# Patient Record
Sex: Female | Born: 1975
Health system: Southern US, Community
[De-identification: ages and names within clinical notes are randomized; demographics above are authoritative.]

## PROBLEM LIST (undated history)

## (undated) DIAGNOSIS — K7689 Other specified diseases of liver: Secondary | ICD-10-CM

## (undated) HISTORY — DX: Other specified diseases of liver: K76.89

---

## 2016-07-09 NOTE — L&D Delivery Note (Signed)
Delivery Note At 9:30 AM a viable female was delivered via Vaginal, Spontaneous Delivery (Presentation: cephalic; ROA).  APGAR: 9, 9; weight 2985 g.   Placenta status: intact, grossly normal.  Cord 3 vessel.  Anesthesia: epidural Episiotomy: None Lacerations: 2nd degree Suture Repair: monocryl Est. Blood Loss (mL):    Mom to postpartum.  Baby to Couplet care / Skin to Skin.  [name] 02/27/2017, 10:01 AM

## 2016-08-13 ENCOUNTER — Encounter (INDEPENDENT_AMBULATORY_CARE_PROVIDER_SITE_OTHER): Payer: Self-pay

## 2016-08-13 ENCOUNTER — Ambulatory Visit (INDEPENDENT_AMBULATORY_CARE_PROVIDER_SITE_OTHER): Payer: BLUE CROSS/BLUE SHIELD | Admitting: Advanced Practice Midwife

## 2016-08-13 ENCOUNTER — Encounter: Payer: Self-pay | Admitting: Advanced Practice Midwife

## 2016-08-13 VITALS — BP 102/62 | HR 84 | Ht 67.0 in | Wt 132.0 lb

## 2016-08-13 DIAGNOSIS — Z113 Encounter for screening for infections with a predominantly sexual mode of transmission: Secondary | ICD-10-CM

## 2016-08-13 DIAGNOSIS — Z3401 Encounter for supervision of normal first pregnancy, first trimester: Secondary | ICD-10-CM

## 2016-08-13 DIAGNOSIS — O09511 Supervision of elderly primigravida, first trimester: Secondary | ICD-10-CM | POA: Diagnosis not present

## 2016-08-13 DIAGNOSIS — Z3689 Encounter for other specified antenatal screening: Secondary | ICD-10-CM | POA: Diagnosis not present

## 2016-08-13 DIAGNOSIS — O0990 Supervision of high risk pregnancy, unspecified, unspecified trimester: Secondary | ICD-10-CM | POA: Insufficient documentation

## 2016-08-13 LAB — OB RESULTS CONSOLE GC/CHLAMYDIA: GC PROBE AMP, GENITAL: NEGATIVE

## 2016-08-13 NOTE — Progress Notes (Signed)
Bedside U/S shows IUP with FHT of 173 BPM and CRL is 24.70mm  GA is [redacted]w[redacted]d. Last pap in [place] 2 years ago.  Never had a positive pap.

## 2016-08-14 LAB — PRENATAL PROFILE (SOLSTAS)
ANTIBODY SCREEN: NEGATIVE
Basophils Absolute: 0 cells/uL (ref 0–200)
Basophils Relative: 0 %
Eosinophils Absolute: 109 cells/uL (ref 15–500)
Eosinophils Relative: 1 %
HEMATOCRIT: 43.5 % (ref 35.0–45.0)
HIV 1&2 Ab, 4th Generation: NONREACTIVE
Hemoglobin: 14.4 g/dL (ref 11.7–15.5)
Hepatitis B Surface Ag: NEGATIVE
LYMPHS PCT: 14 %
Lymphs Abs: 1526 cells/uL (ref 850–3900)
MCH: 28.9 pg (ref 27.0–33.0)
MCHC: 33.1 g/dL (ref 32.0–36.0)
MCV: 87.3 fL (ref 80.0–100.0)
MONO ABS: 872 {cells}/uL (ref 200–950)
MONOS PCT: 8 %
MPV: 9.3 fL (ref 7.5–12.5)
Neutro Abs: 8393 cells/uL — ABNORMAL HIGH (ref 1500–7800)
Neutrophils Relative %: 77 %
Platelets: 358 10*3/uL (ref 140–400)
RBC: 4.98 MIL/uL (ref 3.80–5.10)
RDW: 13.2 % (ref 11.0–15.0)
RH TYPE: POSITIVE
Rubella: 22 Index — ABNORMAL HIGH (ref <0.90)
WBC: 10.9 10*3/uL — AB (ref 3.8–10.8)

## 2016-08-14 LAB — GLUCOSE, RANDOM: Glucose, Bld: 80 mg/dL (ref 65–99)

## 2016-08-14 LAB — PAIN MGMT, PROFILE 6 CONF W/O MM, U
6 Acetylmorphine: NEGATIVE ng/mL (ref <10)
AMPHETAMINES: NEGATIVE ng/mL (ref <500)
Alcohol Metabolites: NEGATIVE ng/mL (ref <500)
BARBITURATES: NEGATIVE ng/mL (ref <300)
Benzodiazepines: NEGATIVE ng/mL (ref <100)
COCAINE METABOLITE: NEGATIVE ng/mL (ref <150)
CREATININE: 78 mg/dL (ref >=20.0)
Marijuana Metabolite: NEGATIVE ng/mL (ref <20)
Methadone Metabolite: NEGATIVE ng/mL (ref <100)
OXIDANT: NEGATIVE ug/mL (ref <200)
OXYCODONE: NEGATIVE ng/mL (ref <100)
Opiates: NEGATIVE ng/mL (ref <100)
PH: 6.98 (ref 4.5–9.0)
Phencyclidine: NEGATIVE ng/mL (ref <25)
Please note:: 0

## 2016-08-14 LAB — GC/CHLAMYDIA PROBE AMP (~~LOC~~) NOT AT ARMC
Chlamydia: NEGATIVE
Neisseria Gonorrhea: NEGATIVE

## 2016-08-14 LAB — SICKLE CELL SCREEN: SICKLE CELL SCREEN: NEGATIVE

## 2016-08-14 LAB — HEMOGLOBIN A1C
Hgb A1c MFr Bld: 4.7 % (ref <5.7)
Mean Plasma Glucose: 88 mg/dL

## 2016-08-15 ENCOUNTER — Encounter: Payer: Self-pay | Admitting: Advanced Practice Midwife

## 2016-08-15 LAB — CULTURE, OB URINE
Colony Count: NO GROWTH
ORGANISM ID, BACTERIA: NO GROWTH

## 2016-08-15 NOTE — Progress Notes (Signed)
  Subjective:    Olivia Salinas is a G1P0 [redacted]w[redacted]d being seen today for her first obstetrical visit.  Her obstetrical history is significant for advanced maternal age. Patient does intend to breast feed. Pregnancy history fully reviewed.  Patient reports no complaints.  Vitals:   08/13/16 1104 08/13/16 1108  BP: 102/62   Pulse: 84   Weight: 132 lb (59.9 kg)   Height:  5\' 7"  (1.702 m)    HISTORY: OB History  Gravida Para Term Preterm AB Living  1            SAB TAB Ectopic Multiple Live Births               # Outcome Date GA Lbr Len/2nd Weight Sex Delivery Anes PTL Lv  1 Current              History reviewed. No pertinent past medical history. History reviewed. No pertinent surgical history. Family History  Problem Relation Age of Onset  . Hypertension Mother   . Arthritis Mother   . Arthritis Father      Exam    Uterus:     Pelvic Exam:    Perineum: No Hemorrhoids   Vulva: Bartholin's, Urethra, Skene's normal   Vagina:  normal discharge   pH:    Cervix: no cervical motion tenderness   Adnexa: no mass, fullness, tenderness   Bony Pelvis: gynecoid  System: Breast:  normal appearance, no masses or tenderness   Skin: normal coloration and turgor, no rashes    Neurologic: oriented, grossly non-focal   Extremities: normal strength, tone, and muscle mass   HEENT neck supple with midline trachea   Mouth/Teeth mucous membranes moist, pharynx normal without lesions   Neck supple   Cardiovascular: regular rate and rhythm   Respiratory:  appears well, vitals normal, no respiratory distress, acyanotic, normal RR, ear and throat exam is normal, neck free of mass or lymphadenopathy, chest clear, no wheezing, crepitations, rhonchi, normal symmetric air entry   Abdomen: soft, non-tender; bowel sounds normal; no masses,  no organomegaly   Urinary: urethral meatus normal      Assessment:    Pregnancy: G1P0 Patient Active Problem List   Diagnosis Date Noted  . Supervision  of normal pregnancy 08/13/2016  . High-risk pregnancy, primigravida of advanced maternal age in first trimester 08/13/2016        Plan:     Initial labs drawn. Prenatal vitamins. Problem list reviewed and updated. Genetic Screening discussed Integrated Screen: Will order panorama when appropriate due to AMA.  Ultrasound discussed; fetal survey: requested.  Follow up in 4 weeks. 50% of 30 min visit spent on counseling and coordination of care.   Welcomed to practice Routines reviewed   Hansel Feinstein 08/15/2016

## 2016-08-21 LAB — CFVANTAGE CYSTIC FIB EXP SCREEN

## 2016-09-10 ENCOUNTER — Ambulatory Visit (INDEPENDENT_AMBULATORY_CARE_PROVIDER_SITE_OTHER): Payer: BLUE CROSS/BLUE SHIELD | Admitting: Certified Nurse Midwife

## 2016-09-10 VITALS — BP 104/70 | HR 91 | Wt 130.0 lb

## 2016-09-10 DIAGNOSIS — O99612 Diseases of the digestive system complicating pregnancy, second trimester: Secondary | ICD-10-CM | POA: Diagnosis not present

## 2016-09-10 DIAGNOSIS — K5900 Constipation, unspecified: Secondary | ICD-10-CM | POA: Diagnosis not present

## 2016-09-10 DIAGNOSIS — Z3402 Encounter for supervision of normal first pregnancy, second trimester: Secondary | ICD-10-CM

## 2016-09-10 NOTE — Progress Notes (Signed)
Subjective:  Olivia Salinas is a 41 y.o. G1P0 at [redacted]w[redacted]d being seen today for ongoing prenatal care.  She is currently monitored for the following issues for this low-risk pregnancy and has Supervision of normal pregnancy and High-risk pregnancy, primigravida of advanced maternal age in first trimester on her problem list.  Patient reports constipation and hard stools.  Contractions: Not present. Vag. Bleeding: None.   . Denies leaking of fluid.   The following portions of the patient's history were reviewed and updated as appropriate: allergies, current medications, past family history, past medical history, past social history, past surgical history and problem list. Problem list updated.  Objective:   Vitals:   09/10/16 0834  BP: 104/70  Pulse: 91  Weight: 130 lb (59 kg)    Fetal Status: Fetal Heart Rate (bpm): 166         General:  Alert, oriented and cooperative. Patient is in no acute distress.  Skin: Skin is warm and dry. No rash noted.   Cardiovascular: Normal heart rate noted  Respiratory: Normal respiratory effort, no problems with respiration noted  Abdomen: Soft, gravid, appropriate for gestational age. Pain/Pressure: Absent     Pelvic: Vag. Bleeding: None Vag D/C Character: Thin   Cervical exam deferred        Extremities: Normal range of motion.  Edema: None  Mental Status: Normal mood and affect. Normal behavior. Normal judgment and thought content.   Urinalysis: Urine Protein: Negative Urine Glucose: Negative  Assessment and Plan:  Pregnancy: G1P0 at [redacted]w[redacted]d  1. Encounter for supervision of normal first pregnancy in second trimester - Second trimester anticipatory guidance - schedule anatomy US - Panorama today   2. Constipation during pregnancy in second trimester - increase water intake - more dietary fiber - stool softener  Preterm labor symptoms and general obstetric precautions including but not limited to vaginal bleeding, contractions, leaking of fluid  and fetal movement were reviewed in detail with the patient. Please refer to After Visit Summary for other counseling recommendations.  Return in about 4 weeks (around 10/08/2016).   Julianne Handler, CNM

## 2016-09-10 NOTE — Addendum Note (Signed)
Addended by: Sherryle Lis L on: 09/10/2016 09:00 AM   Modules accepted: Orders

## 2016-09-20 ENCOUNTER — Encounter: Payer: Self-pay | Admitting: *Deleted

## 2016-09-20 DIAGNOSIS — Z3402 Encounter for supervision of normal first pregnancy, second trimester: Secondary | ICD-10-CM

## 2016-10-08 ENCOUNTER — Ambulatory Visit (INDEPENDENT_AMBULATORY_CARE_PROVIDER_SITE_OTHER): Payer: BLUE CROSS/BLUE SHIELD | Admitting: Advanced Practice Midwife

## 2016-10-08 ENCOUNTER — Encounter: Payer: Self-pay | Admitting: Advanced Practice Midwife

## 2016-10-08 VITALS — BP 87/60 | HR 78 | Wt 131.0 lb

## 2016-10-08 DIAGNOSIS — O99612 Diseases of the digestive system complicating pregnancy, second trimester: Secondary | ICD-10-CM

## 2016-10-08 DIAGNOSIS — K5900 Constipation, unspecified: Secondary | ICD-10-CM

## 2016-10-08 NOTE — Progress Notes (Signed)
   PRENATAL VISIT NOTE  Subjective:  Olivia Salinas is a 41 y.o. G1P0 at [redacted]w[redacted]d being seen today for ongoing prenatal care.  She is currently monitored for the following issues for this low-risk pregnancy and has Supervision of normal pregnancy and High-risk pregnancy, primigravida of advanced maternal age in first trimester on her problem list.  Patient reports no complaints.  Contractions: Not present. Vag. Bleeding: None.  Movement: Absent. Denies leaking of fluid.   The following portions of the patient's history were reviewed and updated as appropriate: allergies, current medications, past family history, past medical history, past social history, past surgical history and problem list. Problem list updated.  Objective:   Vitals:   10/08/16 0816  BP: (!) 87/60  Pulse: 78  Weight: 131 lb (59.4 kg)    Fetal Status: Fetal Heart Rate (bpm): 156   Movement: Absent     General:  Alert, oriented and cooperative. Patient is in no acute distress.  Skin: Skin is warm and dry. No rash noted.   Cardiovascular: Normal heart rate noted  Respiratory: Normal respiratory effort, no problems with respiration noted  Abdomen: Soft, gravid, appropriate for gestational age. Pain/Pressure: Present     Pelvic:  Cervical exam deferred        Extremities: Normal range of motion.  Edema: None  Mental Status: Normal mood and affect. Normal behavior. Normal judgment and thought content.   Assessment and Plan:  Pregnancy: G1P0 at [redacted]w[redacted]d  1. Constipation during pregnancy in second trimester      Discussed fiber laxatives  Preterm labor symptoms and general obstetric precautions including but not limited to vaginal bleeding, contractions, leaking of fluid and fetal movement were reviewed in detail with the patient. Please refer to After Visit Summary for other counseling recommendations.  Return in about 4 weeks (around 11/05/2016) for [employer].   Seabron Spates, CNM

## 2016-10-08 NOTE — Patient Instructions (Signed)
Methylcellulose capsules or tablets What is this medicine? METHYLCELLULOSE (meth ill SELL yoo lose) is a bulk-forming laxative. This medicine is used to treat constipation. This medicine may be used for other purposes; ask your health care provider or pharmacist if you have questions. COMMON BRAND NAME(S): Citrucel, Fiber Therapy What should I tell my health care provider before I take this medicine? They need to know if you have any of these conditions: -blockage of the intestines or bowel -change in bowel habits for more than 14 days -nausea or vomiting -phenylketonuria -stomach pain -trouble swallowing -an unusual or allergic reaction to methylcellulose, other medicines, foods, dyes, or preservatives -pregnant or trying or get pregnant -breast-feeding How should I use this medicine? Take this medicine by mouth with a full glass of water. Follow the directions on the package labeling, or take as directed by your health care professional. Take your medicine at regular intervals. Do not take your medicine more often than directed. Talk to your pediatrician regarding the use of this medicine in children. While this drug may be prescribed for children as young as 1 years old for selected conditions, precautions do apply. Overdosage: If you think you have taken too much of this medicine contact a poison control center or emergency room at once. NOTE: This medicine is only for you. Do not share this medicine with others. What if I miss a dose? If you miss a dose, take it as soon as you can. If it is almost time for your next dose, take only that dose. Do not take double or extra doses. What may interact with this medicine? Interactions are not expected. This list may not describe all possible interactions. Give your health care provider a list of all the medicines, herbs, non-prescription drugs, or dietary supplements you use. Also tell them if you smoke, drink alcohol, or use illegal drugs.  Some items may interact with your medicine. What should I watch for while using this medicine? This medicine can take up to 3 days to work. Check with your doctor or health care professional if your symptoms do not start to get better or if they get worse. See your doctor if you have to treat your constipation for more than 1 week. Avoid taking other medicines within 2 hours of taking this medicine. Drink several glasses of water a day while you are taking this medicine. This will help to relieve constipation and prevent dehydration. What side effects may I notice from receiving this medicine? Side effects that you should report to your doctor or health care professional as soon as possible: -allergic reactions like skin rash, itching or hives, swelling of the face, lips, or tongue -breathing problems -chest pain -nausea, vomiting -rectal bleeding -trouble swallowing Side effects that usually do not require medical attention (report to your doctor or health care professional if they continue or are bothersome): -diarrhea -headache -stomach cramps This list may not describe all possible side effects. Call your doctor for medical advice about side effects. You may report side effects to FDA at 1-800-FDA-1088. Where should I keep my medicine? Keep out of the reach of children. Store at room temperature between 15 and 30 degrees C (59 and 86 degrees F). Do not freeze. Protect from moisture. Throw away any unused medicine after the expiration date. NOTE: This sheet is a summary. It may not cover all possible information. If you have questions about this medicine, talk to your doctor, pharmacist, or health care provider.  2018 Elsevier/Gold Standard (2008-01-12  15:12:13)  

## 2016-10-08 NOTE — Progress Notes (Signed)
Pt complains of constipation 

## 2016-10-23 ENCOUNTER — Ambulatory Visit (HOSPITAL_COMMUNITY)
Admission: RE | Admit: 2016-10-23 | Discharge: 2016-10-23 | Disposition: A | Discharge status: Home / Self Care | Payer: BLUE CROSS/BLUE SHIELD | Source: Ambulatory Visit | Attending: Certified Nurse Midwife | Admitting: Certified Nurse Midwife

## 2016-10-23 ENCOUNTER — Encounter (HOSPITAL_COMMUNITY): Payer: Self-pay

## 2016-10-23 ENCOUNTER — Other Ambulatory Visit: Payer: Self-pay | Admitting: Certified Nurse Midwife

## 2016-10-23 DIAGNOSIS — Z3A19 19 weeks gestation of pregnancy: Secondary | ICD-10-CM | POA: Diagnosis not present

## 2016-10-23 DIAGNOSIS — O09512 Supervision of elderly primigravida, second trimester: Secondary | ICD-10-CM | POA: Insufficient documentation

## 2016-10-23 DIAGNOSIS — Z3689 Encounter for other specified antenatal screening: Secondary | ICD-10-CM | POA: Diagnosis present

## 2016-10-23 DIAGNOSIS — Z3402 Encounter for supervision of normal first pregnancy, second trimester: Secondary | ICD-10-CM

## 2016-11-05 ENCOUNTER — Ambulatory Visit (INDEPENDENT_AMBULATORY_CARE_PROVIDER_SITE_OTHER): Payer: BLUE CROSS/BLUE SHIELD | Admitting: Advanced Practice Midwife

## 2016-11-05 DIAGNOSIS — O09522 Supervision of elderly multigravida, second trimester: Secondary | ICD-10-CM

## 2016-11-05 DIAGNOSIS — O0992 Supervision of high risk pregnancy, unspecified, second trimester: Secondary | ICD-10-CM

## 2016-11-05 DIAGNOSIS — O0990 Supervision of high risk pregnancy, unspecified, unspecified trimester: Secondary | ICD-10-CM

## 2016-11-05 NOTE — Patient Instructions (Signed)
Second Trimester of Pregnancy The second trimester is from week 14 through week 27 (months 4 through 6). The second trimester is often a time when you feel your best. Your body has adjusted to being pregnant, and you begin to feel better physically. Usually, morning sickness has lessened or quit completely, you may have more energy, and you may have an increase in appetite. The second trimester is also a time when the fetus is growing rapidly. At the end of the sixth month, the fetus is about 9 inches long and weighs about 1 pounds. You will likely begin to feel the baby move (quickening) between 16 and 20 weeks of pregnancy. Body changes during your second trimester Your body continues to go through many changes during your second trimester. The changes vary from woman to woman.  Your weight will continue to increase. You will notice your lower abdomen bulging out.  You may begin to get stretch marks on your hips, abdomen, and breasts.  You may develop headaches that can be relieved by medicines. The medicines should be approved by your health care provider.  You may urinate more often because the fetus is pressing on your bladder.  You may develop or continue to have heartburn as a result of your pregnancy.  You may develop constipation because certain hormones are causing the muscles that push waste through your intestines to slow down.  You may develop hemorrhoids or swollen, bulging veins (varicose veins).  You may have back pain. This is caused by:  Weight gain.  Pregnancy hormones that are relaxing the joints in your pelvis.  A shift in weight and the muscles that support your balance.  Your breasts will continue to grow and they will continue to become tender.  Your gums may bleed and may be sensitive to brushing and flossing.  Dark spots or blotches (chloasma, mask of pregnancy) may develop on your face. This will likely fade after the baby is born.  A dark line from your  belly button to the pubic area (linea nigra) may appear. This will likely fade after the baby is born.  You may have changes in your hair. These can include thickening of your hair, rapid growth, and changes in texture. Some women also have hair loss during or after pregnancy, or hair that feels dry or thin. Your hair will most likely return to normal after your baby is born. What to expect at prenatal visits During a routine prenatal visit:  You will be weighed to make sure you and the fetus are growing normally.  Your blood pressure will be taken.  Your abdomen will be measured to track your baby's growth.  The fetal heartbeat will be listened to.  Any test results from the previous visit will be discussed. Your health care provider may ask you:  How you are feeling.  If you are feeling the baby move.  If you have had any abnormal symptoms, such as leaking fluid, bleeding, severe headaches, or abdominal cramping.  If you are using any tobacco products, including cigarettes, chewing tobacco, and electronic cigarettes.  If you have any questions. Other tests that may be performed during your second trimester include:  Blood tests that check for:  Low iron levels (anemia).  High blood sugar that affects pregnant women (gestational diabetes) between 24 and 28 weeks.  Rh antibodies. This is to check for a protein on red blood cells (Rh factor).  Urine tests to check for infections, diabetes, or protein in the   urine.  An ultrasound to confirm the proper growth and development of the baby.  An amniocentesis to check for possible genetic problems.  Fetal screens for spina bifida and Down syndrome.  HIV (human immunodeficiency virus) testing. Routine prenatal testing includes screening for HIV, unless you choose not to have this test. Follow these instructions at home: Medicines   Follow your health care provider's instructions regarding medicine use. Specific medicines may  be either safe or unsafe to take during pregnancy.  Take a prenatal vitamin that contains at least 600 micrograms (mcg) of folic acid.  If you develop constipation, try taking a stool softener if your health care provider approves. Eating and drinking   Eat a balanced diet that includes fresh fruits and vegetables, whole grains, good sources of protein such as meat, eggs, or tofu, and low-fat dairy. Your health care provider will help you determine the amount of weight gain that is right for you.  Avoid raw meat and uncooked cheese. These carry germs that can cause birth defects in the baby.  If you have low calcium intake from food, talk to your health care provider about whether you should take a daily calcium supplement.  Limit foods that are high in fat and processed sugars, such as fried and sweet foods.  To prevent constipation:  Drink enough fluid to keep your urine clear or pale yellow.  Eat foods that are high in fiber, such as fresh fruits and vegetables, whole grains, and beans. Activity   Exercise only as directed by your health care provider. Most women can continue their usual exercise routine during pregnancy. Try to exercise for 30 minutes at least 5 days a week. Stop exercising if you experience uterine contractions.  Avoid heavy lifting, wear low heel shoes, and practice good posture.  A sexual relationship may be continued unless your health care provider directs you otherwise. Relieving pain and discomfort   Wear a good support bra to prevent discomfort from breast tenderness.  Take warm sitz baths to soothe any pain or discomfort caused by hemorrhoids. Use hemorrhoid cream if your health care provider approves.  Rest with your legs elevated if you have leg cramps or low back pain.  If you develop varicose veins, wear support hose. Elevate your feet for 15 minutes, 3-4 times a day. Limit salt in your diet. Prenatal Care   Write down your questions. Take them  to your prenatal visits.  Keep all your prenatal visits as told by your health care provider. This is important. Safety   Wear your seat belt at all times when driving.  Make a list of emergency phone numbers, including numbers for family, friends, the hospital, and police and fire departments. General instructions   Ask your health care provider for a referral to a local prenatal education class. Begin classes no later than the beginning of month 6 of your pregnancy.  Ask for help if you have counseling or nutritional needs during pregnancy. Your health care provider can offer advice or refer you to specialists for help with various needs.  Do not use hot tubs, steam rooms, or saunas.  Do not douche or use tampons or scented sanitary pads.  Do not cross your legs for long periods of time.  Avoid cat litter boxes and soil used by cats. These carry germs that can cause birth defects in the baby and possibly loss of the fetus by miscarriage or stillbirth.  Avoid all smoking, herbs, alcohol, and unprescribed drugs. Chemicals in   these products can affect the formation and growth of the baby.  Do not use any products that contain nicotine or tobacco, such as cigarettes and e-cigarettes. If you need help quitting, ask your health care provider.  Visit your dentist if you have not gone yet during your pregnancy. Use a soft toothbrush to brush your teeth and be gentle when you floss. Contact a health care provider if:  You have dizziness.  You have mild pelvic cramps, pelvic pressure, or nagging pain in the abdominal area.  You have persistent nausea, vomiting, or diarrhea.  You have a bad smelling vaginal discharge.  You have pain when you urinate. Get help right away if:  You have a fever.  You are leaking fluid from your vagina.  You have spotting or bleeding from your vagina.  You have severe abdominal cramping or pain.  You have rapid weight gain or weight loss.  You  have shortness of breath with chest pain.  You notice sudden or extreme swelling of your face, hands, ankles, feet, or legs.  You have not felt your baby move in over an hour.  You have severe headaches that do not go away when you take medicine.  You have vision changes. Summary  The second trimester is from week 14 through week 27 (months 4 through 6). It is also a time when the fetus is growing rapidly.  Your body goes through many changes during pregnancy. The changes vary from woman to woman.  Avoid all smoking, herbs, alcohol, and unprescribed drugs. These chemicals affect the formation and growth your baby.  Do not use any tobacco products, such as cigarettes, chewing tobacco, and e-cigarettes. If you need help quitting, ask your health care provider.  Contact your health care provider if you have any questions. Keep all prenatal visits as told by your health care provider. This is important. This information is not intended to replace advice given to you by your health care provider. Make sure you discuss any questions you have with your health care provider. Document Released: 06/19/2001 Document Revised: 12/01/2015 Document Reviewed: 08/26/2012 Elsevier Interactive Patient Education  2017 [place] [address], [address] [place], [place]  [address] Phone - [phone]   Fax - [phone]  [facility] [address] [place], [place] [address] Phone - [phone]   Fax - [place] [address] [place], [place]  [address] Phone - [phone]   Fax - [phone]  [place] [place] [address] [place], [place]  [address] Phone - [phone]   Fax - [phone]  [place] [address] [place], [place]  [address] Phone - [phone]   Fax - [phone]  [facility] [address],  [address] [place], [place]  [address] Phone - [phone]   Fax - [place] [address] [place], [place]  [address] Phone - [phone]   Fax - [phone]  [place] [address] [place], [place] [address] [place], [place]  [address] Phone - [phone]   Fax - [place] [address] [place],   [address] Phone - [phone]   Fax - [phone] [facility] [place],   [address] Phone - [phone]   Fax - [place]  [place] [address] [place], [place]  [address] Phone - [phone]   Fax - [phone]  [facility] [place], [place], [place]  [address] Phone - [phone]   Fax - [place] [address], [place], [place]  [address] Phone - [phone]   Fax - [phone]  [facility] [address], [place], [place]  [address] Phone - [place], [place]  [address] Phone - [phone]   Fax - [place] [address], [place], [place]  [address] Phone - [phone]   Fax - [phone]  [place] [address] [place], [place]  [address] Phone - [phone]   Fax - [place], [place]  [address] Phone - [phone]   Fax - [place], [place], [place]  [address] Phone - [phone]   Fax - [place] [address] [place], [place]  [address] Phone - [phone]   Fax - [phone]  [place] [address] [place], [place]  [address] Phone - [phone]   Fax -  [place] [address] [place], [place]  [address] Phone - [phone]   Fax - [phone]  [place] [address] [place], [place]  [address] Phone - [phone]   Fax - [phone] [place] [address] [place], [place]  [address] Phone - [phone]   Fax - [place] [address] [place], [place]  [address] Phone - [phone]   Fax - [place] [address] [place], [place]  [address] Phone - [phone]   Fax - [phone]  [place] [address] [place] [address] Phone [phone]  Fax [phone]

## 2016-11-05 NOTE — Progress Notes (Signed)
   PRENATAL VISIT NOTE  Subjective:  Olivia Salinas is a 41 y.o. G1P0 at [redacted]w[redacted]d being seen today for ongoing prenatal care.  She is currently monitored for the following issues for this high-risk pregnancy and has Supervision of high risk pregnancy, antepartum and High-risk pregnancy, primigravida of advanced maternal age in first trimester on her problem list.  Patient reports no complaints.  Contractions: Not present. Vag. Bleeding: None.  Movement: Present. Denies leaking of fluid.   The following portions of the patient's history were reviewed and updated as appropriate: allergies, current medications, past family history, past medical history, past social history, past surgical history and problem list. Problem list updated.  Objective:   Vitals:   11/05/16 0840  BP: 90/60  Pulse: 76  Weight: 135 lb (61.2 kg)    Fetal Status: Fetal Heart Rate (bpm): 159 Fundal Height: 22 cm Movement: Present     General:  Alert, oriented and cooperative. Patient is in no acute distress.  Skin: Skin is warm and dry. No rash noted.   Cardiovascular: Normal heart rate noted  Respiratory: Normal respiratory effort, no problems with respiration noted  Abdomen: Soft, gravid, appropriate for gestational age. Pain/Pressure: Absent     Pelvic:  Cervical exam deferred        Extremities: Normal range of motion.  Edema: None  Mental Status: Normal mood and affect. Normal behavior. Normal judgment and thought content.   Assessment and Plan:  Pregnancy: G1P0 at [redacted]w[redacted]d  1. Supervision of high risk pregnancy, antepartum   Preterm labor symptoms and general obstetric precautions including but not limited to vaginal bleeding, contractions, leaking of fluid and fetal movement were reviewed in detail with the patient. Please refer to After Visit Summary for other counseling recommendations.  Return in about 4 weeks (around 12/03/2016) for ROB.   Manya Silvas, CNM

## 2016-12-05 ENCOUNTER — Ambulatory Visit (INDEPENDENT_AMBULATORY_CARE_PROVIDER_SITE_OTHER): Payer: BLUE CROSS/BLUE SHIELD | Admitting: Obstetrics & Gynecology

## 2016-12-05 VITALS — BP 88/51 | HR 78 | Wt 139.0 lb

## 2016-12-05 DIAGNOSIS — O09512 Supervision of elderly primigravida, second trimester: Secondary | ICD-10-CM

## 2016-12-05 DIAGNOSIS — O0992 Supervision of high risk pregnancy, unspecified, second trimester: Secondary | ICD-10-CM

## 2016-12-05 DIAGNOSIS — O0990 Supervision of high risk pregnancy, unspecified, unspecified trimester: Secondary | ICD-10-CM

## 2016-12-05 NOTE — Progress Notes (Signed)
   PRENATAL VISIT NOTE  Subjective:  Olivia Salinas is a 41 y.o. G1P0 at [redacted]w[redacted]d being seen today for ongoing prenatal care.  She is currently monitored for the following issues for this high-risk pregnancy and has Supervision of high risk pregnancy, antepartum and High-risk pregnancy, primigravida of advanced maternal age in first trimester on her problem list.  Patient reports no complaints.  Contractions: Not present. Vag. Bleeding: None.  Movement: Present. Denies leaking of fluid.   The following portions of the patient's history were reviewed and updated as appropriate: allergies, current medications, past family history, past medical history, past social history, past surgical history and problem list. Problem list updated.  Objective:   Vitals:   12/05/16 0828  BP: (!) 88/51  Pulse: 78  Weight: 139 lb (63 kg)    Fetal Status: Fetal Heart Rate (bpm): 155   Movement: Present     General:  Alert, oriented and cooperative. Patient is in no acute distress.  Skin: Skin is warm and dry. No rash noted.   Cardiovascular: Normal heart rate noted  Respiratory: Normal respiratory effort, no problems with respiration noted  Abdomen: Soft, gravid, appropriate for gestational age. Pain/Pressure: Absent     Pelvic:  Cervical exam deferred        Extremities: Normal range of motion.  Edema: None  Mental Status: Normal mood and affect. Normal behavior. Normal judgment and thought content.   Assessment and Plan:  Pregnancy: G1P0 at [redacted]w[redacted]d  1. Supervision of high risk pregnancy, antepartum -GTT and tdap next visit -wt gain on ly 7 pounds so far this pregnancy.  BMI 20 at beginning of pregnancy; encouraged Ensure shake Salinas to see if this increases weight gain. -Pt opted to not sign up for App on ly [employer].  Preterm labor symptoms and general obstetric precautions including but not limited to vaginal bleeding, contractions, leaking of fluid and fetal movement were reviewed in detail  with the patient. Please refer to After Visit Summary for other counseling recommendations.  Return in about 2 weeks (around 12/19/2016).   Silas Sacramento, MD

## 2016-12-24 ENCOUNTER — Ambulatory Visit (INDEPENDENT_AMBULATORY_CARE_PROVIDER_SITE_OTHER): Payer: BLUE CROSS/BLUE SHIELD | Admitting: Obstetrics & Gynecology

## 2016-12-24 VITALS — BP 90/49 | HR 73 | Wt 144.0 lb

## 2016-12-24 DIAGNOSIS — O0990 Supervision of high risk pregnancy, unspecified, unspecified trimester: Secondary | ICD-10-CM

## 2016-12-24 DIAGNOSIS — Z23 Encounter for immunization: Secondary | ICD-10-CM

## 2016-12-24 DIAGNOSIS — O09511 Supervision of elderly primigravida, first trimester: Secondary | ICD-10-CM

## 2016-12-24 DIAGNOSIS — Z3493 Encounter for supervision of normal pregnancy, unspecified, third trimester: Secondary | ICD-10-CM

## 2016-12-24 LAB — CBC
HCT: 35.9 % (ref 35.0–45.0)
HEMOGLOBIN: 11.5 g/dL — AB (ref 11.7–15.5)
MCH: 28.5 pg (ref 27.0–33.0)
MCHC: 32 g/dL (ref 32.0–36.0)
MCV: 89.1 fL (ref 80.0–100.0)
MPV: 9.5 fL (ref 7.5–12.5)
PLATELETS: 314 10*3/uL (ref 140–400)
RBC: 4.03 MIL/uL (ref 3.80–5.10)
RDW: 12.9 % (ref 11.0–15.0)
WBC: 13.2 10*3/uL — AB (ref 3.8–10.8)

## 2016-12-24 NOTE — Progress Notes (Signed)
   PRENATAL VISIT NOTE  Subjective:  Olivia Salinas is a 41 y.o. G1P0 at [redacted]w[redacted]d being seen today for ongoing prenatal care.  She is currently monitored for the following issues for this low-risk pregnancy and has Supervision of high risk pregnancy, antepartum and High-risk pregnancy, primigravida of advanced maternal age in first trimester on her problem list.  Patient reports no complaints.  Contractions: Not present. Vag. Bleeding: None.  Movement: Present. Denies leaking of fluid.   The following portions of the patient's history were reviewed and updated as appropriate: allergies, current medications, past family history, past medical history, past social history, past surgical history and problem list. Problem list updated.  Objective:   Vitals:   12/24/16 0815  BP: (!) 90/49  Pulse: 73  Weight: 144 lb (65.3 kg)    Fetal Status:     Movement: Present     General:  Alert, oriented and cooperative. Patient is in no acute distress.  Skin: Skin is warm and dry. No rash noted.   Cardiovascular: Normal heart rate noted  Respiratory: Normal respiratory effort, no problems with respiration noted  Abdomen: Soft, gravid, appropriate for gestational age. Pain/Pressure: Absent     Pelvic:  Cervical exam deferred        Extremities: Normal range of motion.     Mental Status: Normal mood and affect. Normal behavior. Normal judgment and thought content.   Assessment and Plan:  Pregnancy: G1P0 at [redacted]w[redacted]d  1. Encounter for supervision of normal pregnancy in third trimester, unspecified gravidity  - CBC - HIV antibody (with reflex) - RPR - Tdap vaccine greater than or equal to 7yo IM - 2Hr GTT w/ 1 Hr Carpenter 75 g  2. High-risk pregnancy, primigravida of advanced maternal age in first trimester   3. Supervision of high risk pregnancy, antepartum   Preterm labor symptoms and general obstetric precautions including but not limited to vaginal bleeding, contractions, leaking of fluid  and fetal movement were reviewed in detail with the patient. Please refer to After Visit Summary for other counseling recommendations.  No Follow-up on file.   Emily Filbert, MD

## 2016-12-25 LAB — 2HR GTT W 1 HR, CARPENTER, 75 G
GLUCOSE, 1 HR, GEST: 79 mg/dL (ref <180)
GLUCOSE, FASTING, GEST: 66 mg/dL (ref 65–91)
Glucose, 2 Hr, Gest: 106 mg/dL (ref <153)

## 2016-12-25 LAB — RPR

## 2016-12-25 LAB — HIV ANTIBODY (ROUTINE TESTING W REFLEX): HIV: NONREACTIVE

## 2017-02-04 ENCOUNTER — Ambulatory Visit (INDEPENDENT_AMBULATORY_CARE_PROVIDER_SITE_OTHER): Payer: BLUE CROSS/BLUE SHIELD | Admitting: Advanced Practice Midwife

## 2017-02-04 VITALS — BP 92/62 | HR 75 | Wt 150.0 lb

## 2017-02-04 DIAGNOSIS — Z3493 Encounter for supervision of normal pregnancy, unspecified, third trimester: Secondary | ICD-10-CM

## 2017-02-04 DIAGNOSIS — K5900 Constipation, unspecified: Secondary | ICD-10-CM

## 2017-02-04 DIAGNOSIS — O99612 Diseases of the digestive system complicating pregnancy, second trimester: Secondary | ICD-10-CM

## 2017-02-04 DIAGNOSIS — Z3403 Encounter for supervision of normal first pregnancy, third trimester: Secondary | ICD-10-CM

## 2017-02-04 DIAGNOSIS — O99613 Diseases of the digestive system complicating pregnancy, third trimester: Secondary | ICD-10-CM

## 2017-02-04 MED ORDER — DOCUSATE SODIUM 100 MG PO CAPS: 100.0000 mg | ORAL_CAPSULE | Freq: Two times a day (BID) | ORAL | 2 refills | Status: DC | PRN

## 2017-02-04 NOTE — Progress Notes (Signed)
   PRENATAL VISIT NOTE  Subjective:  Olivia Salinas is a 41 y.o. G1P0 at [redacted]w[redacted]d being seen today for ongoing prenatal care.  She is currently monitored for the following issues for this low-risk pregnancy and has Supervision of high risk pregnancy, antepartum and High-risk pregnancy, primigravida of advanced maternal age in first trimester on her problem list.  Patient reports constipation.  Contractions: Irritability. Vag. Bleeding: None.  Movement: Present. Denies leaking of fluid.   The following portions of the patient's history were reviewed and updated as appropriate: allergies, current medications, past family history, past medical history, past social history, past surgical history and problem list. Problem list updated.  Objective:   Vitals:   02/04/17 0953  BP: 92/62  Pulse: 75  Weight: 150 lb (68 kg)    Fetal Status: Fetal Heart Rate (bpm): 154 Fundal Height: 33 cm Movement: Present     General:  Alert, oriented and cooperative. Patient is in no acute distress.  Skin: Skin is warm and dry. No rash noted.   Cardiovascular: Normal heart rate noted  Respiratory: Normal respiratory effort, no problems with respiration noted  Abdomen: Soft, gravid, appropriate for gestational age.  Pain/Pressure: Absent     Pelvic: Cervical exam deferred        Extremities: Normal range of motion.  Edema: None  Mental Status:  Normal mood and affect. Normal behavior. Normal judgment and thought content.   Assessment and Plan:  Pregnancy: G1P0 at [redacted]w[redacted]d  1. Encounter for supervision of normal pregnancy in third trimester, unspecified gravidity --No regular contractions, good fetal movement  2. Constipation during pregnancy in second trimester --Increase PO fluids and eat high fiber diet - docusate sodium (COLACE) 100 MG capsule; Take 1 capsule (100 mg total) by mouth 2 (two) times daily as needed.  Dispense: 30 capsule; Refill: 2 --Continue Miralax daily PRN  Preterm labor symptoms and  general obstetric precautions including but not limited to vaginal bleeding, contractions, leaking of fluid and fetal movement were reviewed in detail with the patient. Please refer to After Visit Summary for other counseling recommendations.  No Follow-up on file.   Fatima Blank, CNM

## 2017-02-04 NOTE — Patient Instructions (Signed)
High-Fiber Diet Fiber, also called dietary fiber, is a type of carbohydrate found in fruits, vegetables, whole grains, and beans. A high-fiber diet can have many health benefits. Your health care provider may recommend a high-fiber diet to help:  Prevent constipation. Fiber can make your bowel movements more regular.  Lower your cholesterol.  Relieve hemorrhoids, uncomplicated diverticulosis, or irritable bowel syndrome.  Prevent overeating as part of a weight-loss plan.  Prevent heart disease, type 2 diabetes, and certain cancers.  What is my plan? The recommended daily intake of fiber includes:  38 grams for men under age 1.  4 grams for men over age 89.  35 grams for women under age 69.  82 grams for women over age 76.  You can get the recommended daily intake of dietary fiber by eating a variety of fruits, vegetables, grains, and beans. Your health care provider may also recommend a fiber supplement if it is not possible to get enough fiber through your diet. What do I need to know about a high-fiber diet?  Fiber supplements have not been widely studied for their effectiveness, so it is better to get fiber through food sources.  Always check the fiber content on thenutrition facts label of any prepackaged food. Look for foods that contain at least 5 grams of fiber per serving.  Ask your dietitian if you have questions about specific foods that are related to your condition, especially if those foods are not listed in the following section.  Increase your daily fiber consumption gradually. Increasing your intake of dietary fiber too quickly may cause bloating, cramping, or gas.  Drink plenty of water. Water helps you to digest fiber. What foods can I eat? Grains Whole-grain breads. Multigrain cereal. Oats and oatmeal. Brown rice. Barley. Bulgur wheat. [place]. Bran muffins. Popcorn. Rye wafer crackers. Vegetables Sweet potatoes. Spinach. Kale. Artichokes. Cabbage.  Broccoli. Green peas. Carrots. Squash. Fruits Berries. Pears. Apples. Oranges. Avocados. Prunes and raisins. Dried figs. Meats and Other Protein Sources [place], kidney, pinto, and soy beans. Split peas. Lentils. Nuts and seeds. Dairy Fiber-fortified yogurt. Beverages Fiber-fortified soy milk. Fiber-fortified orange juice. Other Fiber bars. The items listed above may not be a complete list of recommended foods or beverages. Contact your dietitian for more options. What foods are not recommended? Grains White bread. Pasta made with refined flour. White rice. Vegetables Fried potatoes. Canned vegetables. Well-cooked vegetables. Fruits Fruit juice. Cooked, strained fruit. Meats and Other Protein Sources Fatty cuts of meat. Fried Sales executive or fried fish. Dairy Milk. Yogurt. Cream cheese. Sour cream. Beverages Soft drinks. Other Cakes and pastries. Butter and oils. The items listed above may not be a complete list of foods and beverages to avoid. Contact your dietitian for more information. What are some tips for including high-fiber foods in my diet?  Eat a wide variety of high-fiber foods.  Make sure that half of all grains consumed each day are whole grains.  Replace breads and cereals made from refined flour or white flour with whole-grain breads and cereals.  Replace white rice with brown rice, bulgur wheat, or millet.  Start the day with a breakfast that is high in fiber, such as a cereal that contains at least 5 grams of fiber per serving.  Use beans in place of meat in soups, salads, or pasta.  Eat high-fiber snacks, such as berries, raw vegetables, nuts, or popcorn. This information is not intended to replace advice given to you by your health care provider. Make sure you discuss any  questions you have with your health care provider. Document Released: 06/25/2005 Document Revised: 12/01/2015 Document Reviewed: 12/08/2013 Elsevier Interactive Patient Education  2017  Elsevier Inc.  

## 2017-02-04 NOTE — Progress Notes (Signed)
C/O constipation.  She does have hemorrhoids now and is using Miralax

## 2017-02-22 ENCOUNTER — Ambulatory Visit (INDEPENDENT_AMBULATORY_CARE_PROVIDER_SITE_OTHER): Payer: BLUE CROSS/BLUE SHIELD | Admitting: Advanced Practice Midwife

## 2017-02-22 VITALS — BP 92/56 | HR 78 | Wt 154.0 lb

## 2017-02-22 DIAGNOSIS — Z113 Encounter for screening for infections with a predominantly sexual mode of transmission: Secondary | ICD-10-CM

## 2017-02-22 DIAGNOSIS — Z3403 Encounter for supervision of normal first pregnancy, third trimester: Secondary | ICD-10-CM | POA: Diagnosis not present

## 2017-02-22 DIAGNOSIS — O99713 Diseases of the skin and subcutaneous tissue complicating pregnancy, third trimester: Secondary | ICD-10-CM

## 2017-02-22 DIAGNOSIS — L299 Pruritus, unspecified: Secondary | ICD-10-CM

## 2017-02-22 DIAGNOSIS — Z789 Other specified health status: Secondary | ICD-10-CM

## 2017-02-22 DIAGNOSIS — Z3493 Encounter for supervision of normal pregnancy, unspecified, third trimester: Secondary | ICD-10-CM

## 2017-02-22 MED ORDER — BREAST PUMP MISC: 0 refills | Status: DC

## 2017-02-22 NOTE — Progress Notes (Signed)
   PRENATAL VISIT NOTE  Subjective:  Olivia Salinas is a 41 y.o. G1P0 at 31w0dbeing seen today for ongoing prenatal care.  She is currently monitored for the following issues for this high-risk pregnancy and has Supervision of high risk pregnancy, antepartum and High-risk pregnancy, primigravida of advanced maternal age in first trimester on her problem list.  Patient reports itching of abdomen and arms w/out rash..  Contractions: Irritability. Vag. Bleeding: None.  Movement: Present. Denies leaking of fluid.   The following portions of the patient's history were reviewed and updated as appropriate: allergies, current medications, past family history, past medical history, past social history, past surgical history and problem list. Problem list updated.  Objective:   Vitals:   02/22/17 0818  BP: (!) 92/56  Pulse: 78  Weight: 154 lb (69.9 kg)    Fetal Status: Fetal Heart Rate (bpm): 146 Fundal Height: 36 cm Movement: Present  Presentation: Vertex  General:  Alert, oriented and cooperative. Patient is in no acute distress.  Skin: Skin is warm and dry. No rash noted.   Cardiovascular: Normal heart rate noted  Respiratory: Normal respiratory effort, no problems with respiration noted  Abdomen: Soft, gravid, appropriate for gestational age.  Pain/Pressure: Present     Pelvic: Cervical exam performed Dilation: Fingertip Effacement (%): 0 Station: Ballotable  Extremities: Normal range of motion.  Edema: Trace  Mental Status:  Normal mood and affect. Normal behavior. Normal judgment and thought content.   Assessment and Plan:  Pregnancy: G1P0 at 384w0d1. Encounter for supervision of normal pregnancy in third trimester, unspecified gravidity  - Urine cytology ancillary only - Culture, beta strep (group b only) - Misc. Devices (BREAST PUMP) MISC; Dispense one breast pump for patient  Dispense: 1 each; Refill: 0  2. Infant exclusively breastfed  - Misc. Devices (BREAST PUMP) MISC;  Dispense one breast pump for patient  Dispense: 1 each; Refill: 0  3. Pruritus of pregnancy in third trimester  - Bile acids, total - Comp Met (CMET)  Term labor symptoms and general obstetric precautions including but not limited to vaginal bleeding, contractions, leaking of fluid and fetal movement were reviewed in detail with the patient. Please refer to After Visit Summary for other counseling recommendations.  Return in about 1 week (around 03/01/2017) for ROB.   ViManya SilvasCNM

## 2017-02-23 LAB — COMPREHENSIVE METABOLIC PANEL
ALBUMIN: 3.3 g/dL — AB (ref 3.6–5.1)
ALT: 9 U/L (ref 6–29)
AST: 14 U/L (ref 10–30)
Alkaline Phosphatase: 130 U/L — ABNORMAL HIGH (ref 33–115)
BUN: 8 mg/dL (ref 7–25)
CHLORIDE: 103 mmol/L (ref 98–110)
CO2: 22 mmol/L (ref 20–32)
CREATININE: 0.55 mg/dL (ref 0.50–1.10)
Calcium: 8.7 mg/dL (ref 8.6–10.2)
Glucose, Bld: 102 mg/dL — ABNORMAL HIGH (ref 65–99)
Potassium: 3.4 mmol/L — ABNORMAL LOW (ref 3.5–5.3)
SODIUM: 137 mmol/L (ref 135–146)
TOTAL PROTEIN: 5.9 g/dL — AB (ref 6.1–8.1)
Total Bilirubin: 0.5 mg/dL (ref 0.2–1.2)

## 2017-02-25 LAB — CULTURE, BETA STREP (GROUP B ONLY)

## 2017-02-25 LAB — URINE CYTOLOGY ANCILLARY ONLY
CHLAMYDIA, DNA PROBE: NEGATIVE
Neisseria Gonorrhea: NEGATIVE

## 2017-02-26 ENCOUNTER — Encounter (HOSPITAL_COMMUNITY): Payer: Self-pay

## 2017-02-26 ENCOUNTER — Inpatient Hospital Stay (HOSPITAL_COMMUNITY)
Admission: AD | Admit: 2017-02-26 | Discharge: 2017-03-01 | DRG: 775 | Disposition: A | Discharge status: Home / Self Care | Payer: BLUE CROSS/BLUE SHIELD | Source: Ambulatory Visit | Attending: Family Medicine | Admitting: Family Medicine

## 2017-02-26 ENCOUNTER — Telehealth: Payer: Self-pay | Admitting: Advanced Practice Midwife

## 2017-02-26 DIAGNOSIS — O2662 Liver and biliary tract disorders in childbirth: Secondary | ICD-10-CM | POA: Diagnosis not present

## 2017-02-26 DIAGNOSIS — Z3A37 37 weeks gestation of pregnancy: Secondary | ICD-10-CM

## 2017-02-26 DIAGNOSIS — O26619 Liver and biliary tract disorders in pregnancy, unspecified trimester: Secondary | ICD-10-CM

## 2017-02-26 DIAGNOSIS — O26649 Intrahepatic cholestasis of pregnancy, unspecified trimester: Secondary | ICD-10-CM | POA: Diagnosis present

## 2017-02-26 DIAGNOSIS — K831 Obstruction of bile duct: Secondary | ICD-10-CM

## 2017-02-26 DIAGNOSIS — O0990 Supervision of high risk pregnancy, unspecified, unspecified trimester: Secondary | ICD-10-CM

## 2017-02-26 DIAGNOSIS — O43123 Velamentous insertion of umbilical cord, third trimester: Secondary | ICD-10-CM | POA: Diagnosis present

## 2017-02-26 LAB — CBC
HEMATOCRIT: 37.3 % (ref 36.0–46.0)
Hemoglobin: 12 g/dL (ref 12.0–15.0)
MCH: 25.5 pg — ABNORMAL LOW (ref 26.0–34.0)
MCHC: 32.2 g/dL (ref 30.0–36.0)
MCV: 79.2 fL (ref 78.0–100.0)
Platelets: 333 10*3/uL (ref 150–400)
RBC: 4.71 MIL/uL (ref 3.87–5.11)
RDW: 15 % (ref 11.5–15.5)
WBC: 11.5 10*3/uL — AB (ref 4.0–10.5)

## 2017-02-26 LAB — TYPE AND SCREEN
ABO/RH(D): O POS
Antibody Screen: NEGATIVE

## 2017-02-26 LAB — BILE ACIDS, TOTAL: Bile Acids Total: 11 umol/L (ref 0–19)

## 2017-02-26 MED ORDER — MISOPROSTOL 25 MCG QUARTER TABLET
25.0000 ug | ORAL_TABLET | ORAL | Status: DC | PRN
  Administered 2017-02-26 – 2017-02-27 (×2): 25 ug via VAGINAL
  Filled 2017-02-26 (×3): qty 1

## 2017-02-26 MED ORDER — ONDANSETRON HCL 4 MG/2ML IJ SOLN: 4.0000 mg | Freq: Four times a day (QID) | INTRAMUSCULAR | Status: DC | PRN

## 2017-02-26 MED ORDER — LIDOCAINE HCL (PF) 1 % IJ SOLN
30.0000 mL | INTRAMUSCULAR | Status: DC | PRN
  Administered 2017-02-27: 30 mL via SUBCUTANEOUS
  Filled 2017-02-26: qty 30

## 2017-02-26 MED ORDER — TERBUTALINE SULFATE 1 MG/ML IJ SOLN
0.2500 mg | Freq: Once | INTRAMUSCULAR | Status: DC | PRN
  Filled 2017-02-26: qty 1

## 2017-02-26 MED ORDER — FENTANYL CITRATE (PF) 100 MCG/2ML IJ SOLN: 100.0000 ug | INTRAMUSCULAR | Status: DC | PRN

## 2017-02-26 MED ORDER — SOD CITRATE-CITRIC ACID 500-334 MG/5ML PO SOLN: 30.0000 mL | ORAL | Status: DC | PRN

## 2017-02-26 MED ORDER — LACTATED RINGERS IV SOLN
INTRAVENOUS | Status: DC
  Administered 2017-02-26 – 2017-02-27 (×2): via INTRAVENOUS

## 2017-02-26 MED ORDER — OXYCODONE-ACETAMINOPHEN 5-325 MG PO TABS: 2.0000 | ORAL_TABLET | ORAL | Status: DC | PRN

## 2017-02-26 MED ORDER — OXYCODONE-ACETAMINOPHEN 5-325 MG PO TABS: 1.0000 | ORAL_TABLET | ORAL | Status: DC | PRN

## 2017-02-26 MED ORDER — LACTATED RINGERS IV SOLN: 500.0000 mL | INTRAVENOUS | Status: DC | PRN

## 2017-02-26 MED ORDER — ACETAMINOPHEN 325 MG PO TABS
650.0000 mg | ORAL_TABLET | ORAL | Status: DC | PRN
Start: 2017-02-26 — End: 2017-02-27

## 2017-02-26 MED ORDER — OXYTOCIN 40 UNITS IN LACTATED RINGERS INFUSION - SIMPLE MED
2.5000 [IU]/h | INTRAVENOUS | Status: DC
  Filled 2017-02-26: qty 1000

## 2017-02-26 MED ORDER — FLEET ENEMA 7-19 GM/118ML RE ENEM: 1.0000 | ENEMA | RECTAL | Status: DC | PRN

## 2017-02-26 MED ORDER — OXYTOCIN BOLUS FROM INFUSION: 500.0000 mL | Freq: Once | INTRAVENOUS | Status: DC

## 2017-02-26 NOTE — H&P (Signed)
LABOR AND DELIVERY ADMISSION HISTORY AND PHYSICAL NOTE  Olivia Salinas is a 41 y.o. female G1P0 with IUP at [redacted]w[redacted]d by LMP presenting for IOL for cholestasis of pregnancy. Patient just diagnosis with cholestasis of pregnancy with bile acids of 11, and told to come for induction of labor.   She reports positiv e fetal movement. She denies leakage of fluid or vaginal bleeding.  Prenatal History/Complications: [facility] Prenatal Labs  Dating LMP and Bedside U/S Blood type: O/POS/-- (02/05 1118)   Genetic Screen  NIPS: NML Girl Antibody:NEG (02/05 1118)  Anatomic [place] Nml female, posterior placenta, marginal cord insertion Rubella: 22.00 (02/05 1118)  GTT Third trimester: F-66 1-79 2-106 RPR: NON REAC (02/05 1118)   Flu vaccine  HBsAg: NEGATIVE (02/05 1118)   TDaP vaccine  12/24/16                                     HIV: NONREACTIVE (02/05 1118)   Baby Food   breast                           GBS: (For PCN allergy, check sensitivities)  Contraception   Undecided, maybe IUD Pap: normal [place] 2 yrs ago  Circumcision  NA   Pediatrician List given   Support Person Husband Sonny    Complications: AMA, cholestasis of pregnancy  Past Medical History: History reviewed. No pertinent past medical history.  Past Surgical History: History reviewed. No pertinent surgical history.  Obstetrical History: OB History    Gravida Para Term Preterm AB Living   1             SAB TAB Ectopic Multiple Live Births                  Social History: Social History   Social History  . Marital status: Married    Spouse name: N/A  . Number of children: N/A  . Years of education: N/A   Social History Main Topics  . Smoking status: Never Smoker  . Smokeless tobacco: Never Used  . Alcohol use No  . Drug use: No  . Sexual activity: Yes    Birth control/ protection: None   Other Topics Concern  . None   Social History Narrative  . None    Family History: Family History  Problem  Relation Age of Onset  . Hypertension Mother   . Arthritis Mother   . Arthritis Father     Allergies: No Known Allergies  Prescriptions Prior to Admission  Medication Sig Dispense Refill Last Dose  . folic acid (FOLVITE) 638 MCG tablet Take 400 mcg by mouth daily.   02/26/2017 at Unknown time  . Prenatal Vit-Fe Fumarate-FA (PRENATAL VITAMIN PO) Take 1 tablet by mouth daily.   02/26/2017 at Unknown time  . Ascorbic Acid (VITAMIN C) 500 MG CAPS Take 1 tablet by mouth daily.   Taking  . docusate sodium (COLACE) 100 MG capsule Take 1 capsule (100 mg total) by mouth 2 (two) times daily as needed. 30 capsule 2 Taking  . Misc. Devices (BREAST PUMP) MISC Dispense one breast pump for patient 1 each 0      Review of Systems  All systems reviewed and negative except as stated in HPI  Physical Exam: Temperature 98.2 F (36.8 C), temperature source Oral, height 5\' 6"  (1.676 m), weight 154 lb (69.9 kg), last  menstrual period 06/08/2016. General appearance: alert and cooperative Lungs: normal WOB, no respiratory distress Heart: regular rate Abdomen: soft, non-tender Extremities: No calf swelling or tenderness Presentation: cephalic Fetal monitoring: baseline rate 150, mod variability, +acel, occasional variable Uterine activity: irregular ctx Dilation: Fingertip Effacement (%): Thick Station: -3 Exam by:: veronica Mensah   Prenatal labs: ABO, Rh: O/POS/-- (02/05 1118) Antibody: NEG (02/05 1118) Rubella: !Error! RPR: NON REAC (06/18 0842)  HBsAg: NEGATIVE (02/05 1118)  HIV: NONREACTIVE (06/18 0842)  GBS:  negative 2 hr GTT: 66, 79, 106 -- negative Genetic screening:  Low risk NIPS Anatomy US: normal female; posterior placenta, marginal cord insertion  Prenatal Transfer Tool  Maternal Diabetes: No Genetic Screening: Normal Maternal Ultrasounds/Referrals: Normal Fetal Ultrasounds or other Referrals:  None Maternal Substance Abuse:  No Significant Maternal Medications:   None Significant Maternal Lab Results: None  Results for orders placed or performed during the hospital encounter of 02/26/17 (from the past 24 hour(s))  CBC   Collection Time: 02/26/17  8:43 PM  Result Value Ref Range   WBC 11.5 (H) 4.0 - 10.5 K/uL   RBC 4.71 3.87 - 5.11 MIL/uL   Hemoglobin 12.0 12.0 - 15.0 g/dL   HCT 37.3 36.0 - 46.0 %   MCV 79.2 78.0 - 100.0 fL   MCH 25.5 (L) 26.0 - 34.0 pg   MCHC 32.2 30.0 - 36.0 g/dL   RDW 15.0 11.5 - 15.5 %   Platelets 333 150 - 400 K/uL  Type and screen [place]   Collection Time: 02/26/17  8:43 PM  Result Value Ref Range   ABO/RH(D) O POS    Antibody Screen PENDING    Sample Expiration 03/01/2017     Patient Active Problem List   Diagnosis Date Noted  . Intrahepatic cholestasis of pregnancy, antepartum 02/26/2017  . Cholestasis of pregnancy 02/26/2017  . Supervision of high risk pregnancy, antepartum 08/13/2016  . High-risk pregnancy, primigravida of advanced maternal age in first trimester 08/13/2016    Assessment: Olivia Salinas is a 41 y.o. G1P0 at [redacted]w[redacted]d here for IOL for cholestasis of pregnancy.   #Labor: Start cervical ripening with cytotec. Will place FB when able #Pain: Per patient's request #FWB: Cat II (reactive, with occasional variables) #ID:  GBS negative #MOF: breast #MOC: undecided #Circ:  N/a (girl)  Olivia Salinas 02/26/2017, 8:51 PM

## 2017-02-26 NOTE — Telephone Encounter (Signed)
Called to notify pt of elevated Bile Acids, Dx ICP and need to IOL today. No answer. Left VM to call [facility] office for results and plan. Called Sherryle Lis, RN at [place] office. Informed her of plan in case pt calls. L&D charge Nurse will call pt when a bed is available.  Tamala Julian, [place], [place] 02/26/2017 11:51 AM

## 2017-02-26 NOTE — Anesthesia Pain Management Evaluation Note (Signed)
  CRNA Pain Management Visit Note  Patient: Olivia Salinas, 41 y.o., female  "Hello I am a member of the anesthesia team at [facility]. We have an anesthesia team available at all times to provide care throughout the hospital, including epidural management and anesthesia for C-section. I don't know your plan for the delivery whether it a natural birth, water birth, IV sedation, nitrous supplementation, doula or epidural, but we want to meet your pain goals."   1.Was your pain managed to your expectations on prior hospitalizations?   No prior hospitalizations  2.What is your expectation for pain management during this hospitalization?     Epidural, IV pain meds and Nitrous Oxide  3.How can we help you reach that goal? Be available  Record the patient's initial score and the patient's pain goal.   Pain: 0  Pain Goal: 5 [facility] wants you to be able to say your pain was always managed very well.  [facility] 02/26/2017

## 2017-02-27 ENCOUNTER — Encounter (HOSPITAL_COMMUNITY): Payer: Self-pay | Admitting: *Deleted

## 2017-02-27 DIAGNOSIS — Z3A37 37 weeks gestation of pregnancy: Secondary | ICD-10-CM

## 2017-02-27 DIAGNOSIS — K831 Obstruction of bile duct: Secondary | ICD-10-CM

## 2017-02-27 DIAGNOSIS — O2662 Liver and biliary tract disorders in childbirth: Secondary | ICD-10-CM

## 2017-02-27 LAB — RPR: RPR Ser Ql: NONREACTIVE

## 2017-02-27 LAB — ABO/RH: ABO/RH(D): O POS

## 2017-02-27 MED ORDER — ACETAMINOPHEN 325 MG PO TABS: 650.0000 mg | ORAL_TABLET | ORAL | Status: DC | PRN

## 2017-02-27 MED ORDER — FENTANYL 2.5 MCG/ML BUPIVACAINE 1/10 % EPIDURAL INFUSION (WH - ANES): 14.0000 mL/h | INTRAMUSCULAR | Status: DC | PRN

## 2017-02-27 MED ORDER — ONDANSETRON HCL 4 MG PO TABS: 4.0000 mg | ORAL_TABLET | ORAL | Status: DC | PRN

## 2017-02-27 MED ORDER — SENNOSIDES-DOCUSATE SODIUM 8.6-50 MG PO TABS
2.0000 | ORAL_TABLET | ORAL | Status: DC
  Administered 2017-02-27 – 2017-03-01 (×2): 2 via ORAL
  Filled 2017-02-27 (×2): qty 2

## 2017-02-27 MED ORDER — BENZOCAINE-MENTHOL 20-0.5 % EX AERO
1.0000 "application " | INHALATION_SPRAY | CUTANEOUS | Status: DC | PRN
  Administered 2017-02-27: 1 via TOPICAL
  Filled 2017-02-27: qty 56

## 2017-02-27 MED ORDER — MISOPROSTOL 50MCG HALF TABLET
50.0000 ug | ORAL_TABLET | Freq: Once | ORAL | Status: AC
  Administered 2017-02-27: 50 ug via ORAL
  Filled 2017-02-27: qty 1

## 2017-02-27 MED ORDER — DIPHENHYDRAMINE HCL 50 MG/ML IJ SOLN: 12.5000 mg | INTRAMUSCULAR | Status: DC | PRN

## 2017-02-27 MED ORDER — PRENATAL MULTIVITAMIN CH
1.0000 | ORAL_TABLET | Freq: Every day | ORAL | Status: DC
  Administered 2017-02-28 – 2017-03-01 (×2): 1 via ORAL
  Filled 2017-02-27 (×2): qty 1

## 2017-02-27 MED ORDER — EPHEDRINE 5 MG/ML INJ
10.0000 mg | INTRAVENOUS | Status: DC | PRN
  Filled 2017-02-27: qty 2

## 2017-02-27 MED ORDER — DIPHENHYDRAMINE HCL 25 MG PO CAPS: 25.0000 mg | ORAL_CAPSULE | Freq: Four times a day (QID) | ORAL | Status: DC | PRN

## 2017-02-27 MED ORDER — WITCH HAZEL-GLYCERIN EX PADS: 1.0000 "application " | MEDICATED_PAD | CUTANEOUS | Status: DC | PRN

## 2017-02-27 MED ORDER — COCONUT OIL OIL
1.0000 "application " | TOPICAL_OIL | Status: DC | PRN
  Administered 2017-02-28: 1 via TOPICAL
  Filled 2017-02-27: qty 120

## 2017-02-27 MED ORDER — SIMETHICONE 80 MG PO CHEW: 80.0000 mg | CHEWABLE_TABLET | ORAL | Status: DC | PRN

## 2017-02-27 MED ORDER — PHENYLEPHRINE 40 MCG/ML (10ML) SYRINGE FOR IV PUSH (FOR BLOOD PRESSURE SUPPORT)
80.0000 ug | PREFILLED_SYRINGE | INTRAVENOUS | Status: DC | PRN
Start: 2017-02-27 — End: 2017-02-27
  Filled 2017-02-27: qty 5

## 2017-02-27 MED ORDER — LACTATED RINGERS IV SOLN: 500.0000 mL | Freq: Once | INTRAVENOUS | Status: DC

## 2017-02-27 MED ORDER — PHENYLEPHRINE 40 MCG/ML (10ML) SYRINGE FOR IV PUSH (FOR BLOOD PRESSURE SUPPORT)
80.0000 ug | PREFILLED_SYRINGE | INTRAVENOUS | Status: DC | PRN
  Filled 2017-02-27: qty 5

## 2017-02-27 MED ORDER — ZOLPIDEM TARTRATE 5 MG PO TABS: 5.0000 mg | ORAL_TABLET | Freq: Every evening | ORAL | Status: DC | PRN

## 2017-02-27 MED ORDER — ONDANSETRON HCL 4 MG/2ML IJ SOLN: 4.0000 mg | INTRAMUSCULAR | Status: DC | PRN

## 2017-02-27 MED ORDER — IBUPROFEN 600 MG PO TABS
600.0000 mg | ORAL_TABLET | Freq: Four times a day (QID) | ORAL | Status: DC
  Administered 2017-02-27 – 2017-03-01 (×8): 600 mg via ORAL
  Filled 2017-02-27 (×8): qty 1

## 2017-02-27 MED ORDER — TETANUS-DIPHTH-ACELL PERTUSSIS 5-2.5-18.5 LF-MCG/0.5 IM SUSP: 0.5000 mL | Freq: Once | INTRAMUSCULAR | Status: DC

## 2017-02-27 MED ORDER — DIBUCAINE 1 % RE OINT: 1.0000 "application " | TOPICAL_OINTMENT | RECTAL | Status: DC | PRN

## 2017-02-27 NOTE — Progress Notes (Signed)
LABOR PROGRESS NOTE  Olivia Salinas is a 41 y.o. G1P0 at [redacted]w[redacted]d  admitted for IOL for cholestasis.   Subjective: Patient doing well. Comfortable  Objective: BP 107/63   Pulse 78   Temp 98.3 F (36.8 C) (Oral)   Resp 20   Ht 5\' 6"  (1.676 m)   Wt 154 lb (69.9 kg)   LMP 06/08/2016   BMI 24.86 kg/m  or  Vitals:   02/26/17 2040 02/26/17 2300  BP:  107/63  Pulse:  78  Resp:  20  Temp: 98.2 F (36.8 C) 98.3 F (36.8 C)  TempSrc: Oral Oral  Weight: 154 lb (69.9 kg)   Height: 5\' 6"  (1.676 m)     SVE; Dilation: Fingertip Effacement (%): Thick Cervical Position: Middle Station: -3 Presentation: Vertex Exam by:: dr Damontae Loppnow FHT: baseline rate 150, moderate varibility, +acel, no decel Toco: ctx q4-7 min   Assessment / Plan: 41 y.o. G1P0 at 107w5d here for IOL for cholestasis of pregnancy  Labor: cytotec #2 placed at 0115 Fetal Wellbeing:  Cat I Pain Control:  Per patient's request Anticipated MOD:  SVD  Gailen Shelter, MD 02/27/2017, 1:25 AM

## 2017-02-27 NOTE — Lactation Note (Signed)
This note was copied from a baby's chart. Lactation Consultation Note  Patient Name: Olivia Salinas [id number] Date: 02/27/2017 Reason for consult: Initial assessment Baby at 2 hr of life. Upon entry baby was cueing. Mom was agreeable to latch help. Mom has large nipples with round firm breast. Discussed baby behavior, feeding frequency, baby belly size, voids, wt loss, breast changes, and nipple care. Demonstrated manual expression, glistening of colostrum noted bilaterally. Given lactation handouts. Aware of OP services and support group.      Maternal Data Formula Feeding for Exclusion: No Has patient been taught Hand Expression?: Yes Does the patient have breastfeeding experience prior to this delivery?: No  Feeding Feeding Type: Breast Fed Length of feed: 20 min  LATCH Score Latch: Grasps breast easily, tongue down, lips flanged, rhythmical sucking.  Audible Swallowing: A few with stimulation  Type of Nipple: Everted at rest and after stimulation  Comfort (Breast/Nipple): Soft / non-tender  Hold (Positioning): Full assist, staff holds infant at breast  LATCH Score: 7  Interventions Interventions: Breast feeding basics reviewed;Assisted with latch;Skin to skin;Breast massage;Hand express;Adjust position;Support pillows;Position options  Lactation Tools Discussed/Used WIC Program: No   Consult Status Consult Status: Follow-up Date: 02/28/17 Follow-up type: In-patient    Denzil Hughes 02/27/2017, 11:39 AM

## 2017-02-27 NOTE — Progress Notes (Addendum)
LABOR PROGRESS NOTE  Olivia Salinas is a 41 y.o. G1P0 at [redacted]w[redacted]d  admitted for IOL for cholestasis.   Subjective: Patient doing well. Feeling some cramping.  Objective: BP (!) 97/57   Pulse 60   Temp 98.1 F (36.7 C) (Oral)   Resp 20   Ht 5\' 6"  (1.676 m)   Wt 154 lb (69.9 kg)   LMP 06/08/2016   BMI 24.86 kg/m  or  Vitals:   02/26/17 2040 02/26/17 2300 02/27/17 0120  BP:  107/63 (!) 97/57  Pulse:  78 60  Resp:  20   Temp: 98.2 F (36.8 C) 98.3 F (36.8 C) 98.1 F (36.7 C)  TempSrc: Oral Oral Oral  Weight: 154 lb (69.9 kg)    Height: 5\' 6"  (1.676 m)      SVE: Dilation: 1 Effacement (%): 50 Cervical Position: Middle Station: -3 Presentation: Vertex Exam by:: dr degele FHT: baseline rate 150, moderate varibility, +acel, variable decels Toco: ctx q1-3 min   Assessment / Plan: 41 y.o. G1P0 at [redacted]w[redacted]d here for IOL for cholestasis of pregnancy  Labor: s/p cytotec x 2. FB placed at 0515.  Fetal Wellbeing:  Cat II Pain Control:  Per patient's request Anticipated MOD:  SVD  Gailen Shelter, MD 02/27/2017, 5:27 AM

## 2017-02-28 NOTE — Lactation Note (Signed)
This note was copied from a baby's chart. Lactation Consultation Note  Patient Name: Olivia Salinas [id number] Date: 02/28/2017 Reason for consult: Follow-up assessment Mom attempting to latch baby using cradle hold.  She states baby won't stay latched and cries at the breast.  Breast expression done and one small drop visible.  Assisted with positioning baby in cross cradle, football and lastly side lying position.  Mom has large nipple and baby tends to latch with shallow latch.  Baby initially very fussy pulling off and on and acting hungry.   No swallows heard. Mom feeling some pinching.  Discussed supplementing with formula due to hunger after feeds and lack of swallowing.  Mom agreeable.  5 french feeding tube used a breast and baby took 12 mls of alimentum and came off breast relaxed and content.  Mom set up with symphony pump and pumping initiated.  Plan is to breastfeed with feeding cues with either 5 french feeding tube or bottle after offering breast giving 15-20 mls of expressed milk/formula, pump both breasts every 3 hours for 15 minutes.  Call for assist with 5 french, latching or pump.  Maternal Data    Feeding Feeding Type: Formula Length of feed: 20 min  LATCH Score Latch: Repeated attempts needed to sustain latch, nipple held in mouth throughout feeding, stimulation needed to elicit sucking reflex.  Audible Swallowing: A few with stimulation  Type of Nipple: Everted at rest and after stimulation  Comfort (Breast/Nipple): Soft / non-tender  Hold (Positioning): Assistance needed to correctly position infant at breast and maintain latch.  LATCH Score: 7  Interventions Interventions: Breast feeding basics reviewed;Breast compression;Adjust position;Assisted with latch;Skin to skin;Support pillows;Position options;Breast massage;Hand express  Lactation Tools Discussed/Used Tools: 26F feeding tube / Syringe Pump Review: Setup, frequency, and cleaning;Milk  Storage Initiated by:: Shiner Date initiated:: 02/28/17   Consult Status Consult Status: Follow-up Date: 03/01/17 Follow-up type: In-patient    Ave Filter 02/28/2017, 3:15 PM

## 2017-02-28 NOTE — Progress Notes (Signed)
POSTPARTUM PROGRESS NOTE  Post Partum Day 1  Subjective:  Olivia Salinas is a 41 y.o. G1P1001 s/p SVD at [redacted]w[redacted]d.  No acute events overnight.  Pt denies problems with ambulating, voiding or po intake.  She denies nausea or vomiting. Lochia Small.   Objective: Blood pressure (!) 92/58, pulse 67, temperature 97.8 F (36.6 C), temperature source Oral, resp. rate 18, height 5\' 6"  (1.676 m), weight 154 lb (69.9 kg), last menstrual period 06/08/2016, SpO2 99 %, unknown if currently breastfeeding.  Physical Exam:  General: alert, cooperative and no distress Chest: no respiratory distress Heart:regular rate, distal pulses intact Abdomen: soft, nontender,  Uterine Fundus: firm, appropriately tender DVT Evaluation: No calf swelling or tenderness Extremities: no edema Skin: warm, dry   Recent Labs  02/26/17 2043  HGB 12.0  HCT 37.3    Assessment/Plan: Olivia Salinas is a 41 y.o. G1P1001 s/p SVD at [redacted]w[redacted]d   PPD#1 - Doing well Feeding: breast Dispo: Plan for discharge tomorrow.   LOS: 2 days   Jenne Pane DegeleMD 02/28/2017, 9:08 AM

## 2017-03-01 ENCOUNTER — Encounter: Payer: BLUE CROSS/BLUE SHIELD | Admitting: Certified Nurse Midwife

## 2017-03-01 MED ORDER — ACETAMINOPHEN 325 MG PO TABS: 650.0000 mg | ORAL_TABLET | ORAL | 0 refills | Status: DC | PRN

## 2017-03-01 MED ORDER — IBUPROFEN 600 MG PO TABS: 600.0000 mg | ORAL_TABLET | Freq: Four times a day (QID) | ORAL | 0 refills | Status: DC

## 2017-03-01 NOTE — Discharge Summary (Signed)
OB Discharge Summary     Patient Name: Olivia Salinas DOB: 07-30-1975 MRN: [id number]  Date of admission: 02/26/2017 Delivering MD: Maia Breslow C   Date of discharge: 03/01/2017  Admitting diagnosis: INDUCTION Intrauterine pregnancy: [redacted]w[redacted]d     Secondary diagnosis:  Active Problems:   Cholestasis of pregnancy   SVD (spontaneous vaginal delivery)  Additional problems: none     Discharge diagnosis: Term Pregnancy Delivered                                                                                                Post partum procedures:none  Augmentation: AROM and Cytotec  Complications: None  Hospital course:  Induction of Labor With Vaginal Delivery   41 y.o. yo G1P1001 at [redacted]w[redacted]d was admitted to the hospital 02/26/2017 for induction of labor.  Indication for induction: Cholestasis of pregnancy.  Patient had an uncomplicated labor course as follows: Membrane Rupture Time/Date: 9:29 AM ,02/27/2017   Intrapartum Procedures: Episiotomy: None [1]                                         Lacerations:  2nd degree [3];Perineal [11]  Patient had delivery of a Viable infant.  Information for the patient's newborn:  Alyzah, Pelly [[id number]]  Delivery Method: Vaginal, Spontaneous Delivery (Filed from Delivery Summary)   02/27/2017  Details of delivery can be found in separate delivery note.  Patient had a routine postpartum course. Patient is discharged home 03/01/17.  Physical exam  Vitals:   02/27/17 1330 02/27/17 1730 02/28/17 0515 03/01/17 0545  BP: (!) 90/54 95/61 (!) 92/58 (!) 96/55  Pulse: (!) 56 61 67 71  Resp: 18 18 18 20   Temp: 98.3 F (36.8 C) 98 F (36.7 C) 97.8 F (36.6 C) 98.3 F (36.8 C)  TempSrc: Oral Oral Oral Oral  SpO2:      Weight:      Height:       General: alert, cooperative and no distress Lochia: appropriate Uterine Fundus: firm Incision: N/A DVT Evaluation: No evidence of DVT seen on physical exam. Negative Homan's sign. No cords  or calf tenderness. No significant calf/ankle edema. Labs: Lab Results  Component Value Date   WBC 11.5 (H) 02/26/2017   HGB 12.0 02/26/2017   HCT 37.3 02/26/2017   MCV 79.2 02/26/2017   PLT 333 02/26/2017   CMP Latest Ref Rng & Units 02/22/2017  Glucose 65 - 99 mg/dL 102(H)  BUN 7 - 25 mg/dL 8  Creatinine 0.50 - 1.10 mg/dL 0.55  Sodium 135 - 146 mmol/L 137  Potassium 3.5 - 5.3 mmol/L 3.4(L)  Chloride 98 - 110 mmol/L 103  CO2 20 - 32 mmol/L 22  Calcium 8.6 - 10.2 mg/dL 8.7  Total Protein 6.1 - 8.1 g/dL 5.9(L)  Total Bilirubin 0.2 - 1.2 mg/dL 0.5  Alkaline Phos 33 - 115 U/L 130(H)  AST 10 - 30 U/L 14  ALT 6 - 29 U/L 9    Discharge instruction: per After Visit Summary and "  Baby and Me Booklet".  After visit meds:  Allergies as of 03/01/2017   No Known Allergies     Medication List    STOP taking these medications   docusate sodium 100 MG capsule Commonly known as:  COLACE     TAKE these medications   acetaminophen 325 MG tablet Commonly known as:  TYLENOL Take 2 tablets (650 mg total) by mouth every 4 (four) hours as needed (for pain scale < 4).   folic acid 818 MCG tablet Commonly known as:  FOLVITE Take 800 mcg by mouth daily.   ibuprofen 600 MG tablet Commonly known as:  ADVIL,MOTRIN Take 1 tablet (600 mg total) by mouth every 6 (six) hours.   PRENATAL VITAMIN PO Take 1 tablet by mouth daily.            Discharge Care Instructions        Start     Ordered   03/01/17 0000  acetaminophen (TYLENOL) 325 MG tablet  Every 4 hours PRN     03/01/17 0930   03/01/17 0000  ibuprofen (ADVIL,MOTRIN) 600 MG tablet  Every 6 hours     03/01/17 0930   02/26/17 0000  OB RESULTS CONSOLE GC/Chlamydia    Comments:  This external order was created through the Results Console.   02/26/17 1955      Diet: routine diet  Activity: Advance as tolerated. Pelvic rest for 6 weeks.   Outpatient follow up:4-6 weeks Follow up Appt: Future Appointments Date Time  Provider [place]  04/05/2017 8:45 AM Julianne Handler, CNM [facility]   Follow up Visit:No Follow-up on file.  Postpartum contraception: None - patient says they want to have another baby soon; I advised her to wait at least a few months before actively trying to conceive in order to let her body recuperate.  Newborn Data: Live born female  Birth Weight: 6 lb 9.3 oz (2985 g) APGAR: 9, 9  Baby Feeding: Breast Disposition:home with mother   03/01/2017 [name], MD  OB FELLOW DISCHARGE ATTESTATION  I have seen and examined this patient and agree with above documentation in the resident's note.   Counseled patient on postpartum contraception and recommended 1-year before getting pregnant again.  Gailen Shelter, MD OB Fellow 9:40 AM

## 2017-03-01 NOTE — Lactation Note (Signed)
This note was copied from a baby's chart. Lactation Consultation Note  Patient Name: Olivia Salinas [id number]'H Date: 03/01/2017  Mom is very comfortable using 5 french feeding tube at breast for supplementation.  We discussed milk coming to volume and engorgement treatment.  Recommended she discontinue formula when breasts become full.  Mom denies any questions.  Lactation services and support information reviewed and encouraged prn.   Maternal Data    Feeding Feeding Type: Breast Milk with Formula added Length of feed: 25 min  LATCH Score Latch: Repeated attempts needed to sustain latch, nipple held in mouth throughout feeding, stimulation needed to elicit sucking reflex.  Audible Swallowing: Spontaneous and intermittent  Type of Nipple: Everted at rest and after stimulation  Comfort (Breast/Nipple): Soft / non-tender  Hold (Positioning): Assistance needed to correctly position infant at breast and maintain latch.  LATCH Score: 8  Interventions Interventions: Assisted with latch;Skin to skin;Breast massage;Hand express;Adjust position;Support pillows  Lactation Tools Discussed/Used Tools: 3F feeding tube / Syringe   Consult Status      Ave Filter 03/01/2017, 10:02 AM

## 2017-03-04 ENCOUNTER — Telehealth (HOSPITAL_COMMUNITY): Payer: Self-pay

## 2017-03-07 NOTE — Telephone Encounter (Signed)
Error or charting

## 2017-04-05 ENCOUNTER — Encounter: Payer: Self-pay | Admitting: Certified Nurse Midwife

## 2017-04-05 ENCOUNTER — Ambulatory Visit (INDEPENDENT_AMBULATORY_CARE_PROVIDER_SITE_OTHER): Payer: BLUE CROSS/BLUE SHIELD | Admitting: Certified Nurse Midwife

## 2017-04-05 VITALS — BP 86/59 | HR 68 | Resp 16 | Ht 67.0 in | Wt 137.0 lb

## 2017-04-05 DIAGNOSIS — R32 Unspecified urinary incontinence: Secondary | ICD-10-CM

## 2017-04-05 DIAGNOSIS — Z1389 Encounter for screening for other disorder: Secondary | ICD-10-CM

## 2017-04-05 NOTE — Progress Notes (Signed)
Post Partum Exam  Olivia Salinas is a 41 y.o. G64P1001 female who presents for a postpartum visit. She is 5 weeks postpartum following a spontaneous vaginal delivery. I have fully reviewed the prenatal and intrapartum course. The delivery was at [redacted]w[redacted]d gestational weeks.  Anesthesia: none. Postpartum course has been unremarkable except for urinary incont. Baby's course has been unremarkable. Baby is feeding by both breast and bottle - Enfamil AR. Bleeding staining only. Bowel function is normal. Bladder function is abnormal: urinary incontinence. Patient is not sexually active. Contraception method is coitus interruptus and condoms. Postpartum depression screening:neg  The following portions of the patient's history were reviewed and updated as appropriate: allergies, current medications, past family history, past medical history, past social history, past surgical history and problem list.  Desires another pregnancy soon.  Review of Systems Pertinent items are noted in HPI.    Objective:  Blood pressure (!) 86/59, pulse 68, resp. rate 16, height 5\' 7"  (1.702 m), weight 137 lb (62.1 kg), currently breastfeeding.  General:  alert, cooperative and no distress   Breasts:    Lungs: Reg effort and rate  Heart:  Reg rate  Abdomen:    Vulva:  normal  Vagina: normal vagina and laceration well healed  Cervix:    Corpus:   Adnexa:    Rectal Exam:         Assessment:  Normal postpartum exam.   Plan:  Recommend 12-18 month interval b/w pregnancies- shorter interval may increase risk for poor pregnancy outcomes Discussed all forms of contraception- desires withdrawal and condoms Continue PNV Follow up in 1 year or prn

## 2017-04-06 LAB — TIQ-MISC

## 2018-04-01 DIAGNOSIS — K7689 Other specified diseases of liver: Secondary | ICD-10-CM | POA: Insufficient documentation

## 2020-05-24 ENCOUNTER — Other Ambulatory Visit: Payer: Self-pay

## 2020-05-24 ENCOUNTER — Encounter: Payer: Self-pay | Admitting: Advanced Practice Midwife

## 2020-05-24 ENCOUNTER — Ambulatory Visit (INDEPENDENT_AMBULATORY_CARE_PROVIDER_SITE_OTHER): Payer: BC Managed Care – PPO | Admitting: Advanced Practice Midwife

## 2020-05-24 ENCOUNTER — Other Ambulatory Visit (HOSPITAL_COMMUNITY)
Admission: RE | Admit: 2020-05-24 | Discharge: 2020-05-24 | Disposition: A | Discharge status: Home / Self Care | Payer: BC Managed Care – PPO | Source: Ambulatory Visit | Attending: Advanced Practice Midwife | Admitting: Advanced Practice Midwife

## 2020-05-24 VITALS — BP 97/51 | HR 70 | Wt 146.1 lb

## 2020-05-24 DIAGNOSIS — O26841 Uterine size-date discrepancy, first trimester: Secondary | ICD-10-CM

## 2020-05-24 DIAGNOSIS — O0990 Supervision of high risk pregnancy, unspecified, unspecified trimester: Secondary | ICD-10-CM | POA: Insufficient documentation

## 2020-05-24 DIAGNOSIS — O0991 Supervision of high risk pregnancy, unspecified, first trimester: Secondary | ICD-10-CM | POA: Insufficient documentation

## 2020-05-24 DIAGNOSIS — O09529 Supervision of elderly multigravida, unspecified trimester: Secondary | ICD-10-CM

## 2020-05-24 DIAGNOSIS — Z3A01 Less than 8 weeks gestation of pregnancy: Secondary | ICD-10-CM

## 2020-05-24 NOTE — Progress Notes (Signed)
Bedside Transvaginal ultrasound reveals 5.[redacted] week gestational sac. No fetal pole identified.   Gestational sac:0.87 cm  5.4 weeks  Both ovaries visualized and appear within normal limits.  Discussed with Hansel Feinstein, CNM will get HCG and repeat in two days. [name] RN   Patient was assessed and managed by nursing staff during this encounter. I have reviewed the chart and agree with the documentation and plan. I have also made any necessary editorial changes.  Hansel Feinstein, CNM 05/24/2020 9:36 AM

## 2020-05-24 NOTE — Progress Notes (Signed)
PRENATAL VISIT NOTE  Subjective:  Olivia Salinas is a 44 y.o. G2P1001 at [redacted]w[redacted]d being seen today for her first prenatal visit for this pregnancy.  She is currently monitored for the following issues for this low-risk pregnancy and has Supervision of high-risk pregnancy; AMA (advanced maternal age) multigravida 35+; and Liver cyst on their problem list.  Patient reports no complaints.  Contractions: Not present. Vag. Bleeding: None.  Movement: Absent. Denies leaking of fluid.   She is planning to breastfeed. Desires unsure for contraception.   The following portions of the patient's history were reviewed and updated as appropriate: allergies, current medications, past family history, past medical history, past social history, past surgical history and problem list.   Objective:   Vitals:   05/24/20 0816  BP: (!) 97/51  Pulse: 70  Weight: 146 lb 1.3 oz (66.3 kg)    Fetal Status:   Fundal Height: 5 cm Movement: Absent     General:  Alert, oriented and cooperative. Patient is in no acute distress.  Skin: Skin is warm and dry. No rash noted.   Cardiovascular: Normal heart rate and rhythm noted  Respiratory: Normal respiratory effort, no problems with respiration noted. Clear to auscultation.   Abdomen: Soft, gravid, appropriate for gestational age. Normal bowel sounds. Non-tender. Pain/Pressure: Absent     Pelvic: Cervical exam performed Dilation: Closed Effacement (%): Thick Station: Ballotable Normal cervical contour, no lesions, no bleeding following pap, normal discharge  Extremities: Normal range of motion.  Edema: None  Mental Status: Normal mood and affect. Normal behavior. Normal judgment and thought content.   Assessment and Plan:  Pregnancy: G2P1001 at [redacted]w[redacted]d 1. Supervision of high risk pregnancy, antepartum     US showed [redacted] wk Gestational sac with no YS or embryo     Discussed this can mean early pregnancy or one not developing normally  - Cytology - PAP( [place]) -  [facility] AMB BABYSCRIPTS OPT IN - SMN1 COPY NUMBER ANALYSIS (SMA Carrier Screen) - Beta hCG quant (ref lab) - CBC/D/Plt+RPR+Rh+ABO+Rub Ab...  2. Antepartum multigravida of advanced maternal age  - Cytology - PAP( [place]) - [facility] AMB BABYSCRIPTS OPT IN - SMN1 COPY NUMBER ANALYSIS (SMA Carrier Screen) - Beta hCG quant (ref lab) - CBC/D/Plt+RPR+Rh+ABO+Rub Ab...  3. Size of fetus inconsistent with dates in first trimester      Plan to do a QUant HCG today and repeat on Thursday to assess for doubling      Then repeat US in 10 days  - Cytology - PAP( [place]) - [facility] AMB BABYSCRIPTS OPT IN - SMN1 COPY NUMBER ANALYSIS (SMA Carrier Screen) - Beta hCG quant (ref lab) - CBC/D/Plt+RPR+Rh+ABO+Rub Ab...  4. [redacted] weeks gestation of pregnancy  - Cytology - PAP( [place]) - [facility] AMB BABYSCRIPTS OPT IN - SMN1 COPY NUMBER ANALYSIS (SMA Carrier Screen) - Beta hCG quant (ref lab) - CBC/D/Plt+RPR+Rh+ABO+Rub Ab...  Initial labs drawn. Continue prenatal vitamins. Discussed and offered genetic screening options, including Quad screen/AFP, NIPS testing, and option to decline testing. Benefits/risks/alternatives reviewed. Pt aware that anatomy US is form of genetic screening with lower accuracy in detecting trisomies than blood work.  Pt chooses genetic screening today. NIPS: ordered. Ultrasound discussed; fetal anatomic survey: ordered. Problem list reviewed and updated. The nature of [facility] with multiple MDs and other Advanced Practice Providers was explained to patient; also emphasized that residents, students are part of our team. Routine obstetric precautions reviewed.   Preterm labor  symptoms and general obstetric precautions including but not limited to vaginal bleeding, contractions, leaking of fluid and fetal movement were reviewed in detail with the patient. Please refer to After Visit Summary for other counseling recommendations.   Return in about  4 weeks (around 06/21/2020), or And return in 2 DAYS to repeat blood test, for [facility].  Future Appointments  Date Time Provider [place]  06/22/2020  8:15 AM Megan Salon, MD [facility] None    Hansel Feinstein, CNM

## 2020-05-24 NOTE — Addendum Note (Signed)
Addended by: Wendelyn Breslow L on: 05/24/2020 10:11 AM   Modules accepted: Orders

## 2020-05-24 NOTE — Patient Instructions (Signed)
First Trimester of Pregnancy The first trimester of pregnancy is from week 1 until the end of week 13 (months 1 through 3). A week after a sperm fertilizes an egg, the egg will implant on the wall of the uterus. This embryo will begin to develop into a baby. Genes from you and your partner will form the baby. The female genes will determine whether the baby will be a boy or a girl. At 6-8 weeks, the eyes and face will be formed, and the heartbeat can be seen on ultrasound. At the end of 12 weeks, all the baby's organs will be formed. Now that you are pregnant, you will want to do everything you can to have a healthy baby. Two of the most important things are to get good prenatal care and to follow your health care provider's instructions. Prenatal care is all the medical care you receive before the baby's birth. This care will help prevent, find, and treat any problems during the pregnancy and childbirth. Body changes during your first trimester Your body goes through many changes during pregnancy. The changes vary from woman to woman.  You may gain or lose a couple of pounds at first.  You may feel sick to your stomach (nauseous) and you may throw up (vomit). If the vomiting is uncontrollable, call your health care provider.  You may tire easily.  You may develop headaches that can be relieved by medicines. All medicines should be approved by your health care provider.  You may urinate more often. Painful urination may mean you have a bladder infection.  You may develop heartburn as a result of your pregnancy.  You may develop constipation because certain hormones are causing the muscles that push stool through your intestines to slow down.  You may develop hemorrhoids or swollen veins (varicose veins).  Your breasts may begin to grow larger and become tender. Your nipples may stick out more, and the tissue that surrounds them (areola) may become darker.  Your gums may bleed and may be  sensitive to brushing and flossing.  Dark spots or blotches (chloasma, mask of pregnancy) may develop on your face. This will likely fade after the baby is born.  Your menstrual periods will stop.  You may have a loss of appetite.  You may develop cravings for certain kinds of food.  You may have changes in your emotions from day to day, such as being excited to be pregnant or being concerned that something may go wrong with the pregnancy and baby.  You may have more vivid and strange dreams.  You may have changes in your hair. These can include thickening of your hair, rapid growth, and changes in texture. Some women also have hair loss during or after pregnancy, or hair that feels dry or thin. Your hair will most likely return to normal after your baby is born. What to expect at prenatal visits During a routine prenatal visit:  You will be weighed to make sure you and the baby are growing normally.  Your blood pressure will be taken.  Your abdomen will be measured to track your baby's growth.  The fetal heartbeat will be listened to between weeks 10 and 14 of your pregnancy.  Test results from any previous visits will be discussed. Your health care provider may ask you:  How you are feeling.  If you are feeling the baby move.  If you have had any abnormal symptoms, such as leaking fluid, bleeding, severe headaches, or abdominal   cramping.  If you are using any tobacco products, including cigarettes, chewing tobacco, and electronic cigarettes.  If you have any questions. Other tests that may be performed during your first trimester include:  Blood tests to find your blood type and to check for the presence of any previous infections. The tests will also be used to check for low iron levels (anemia) and protein on red blood cells (Rh antibodies). Depending on your risk factors, or if you previously had diabetes during pregnancy, you may have tests to check for high blood sugar  that affects pregnant women (gestational diabetes).  Urine tests to check for infections, diabetes, or protein in the urine.  An ultrasound to confirm the proper growth and development of the baby.  Fetal screens for spinal cord problems (spina bifida) and Down syndrome.  HIV (human immunodeficiency virus) testing. Routine prenatal testing includes screening for HIV, unless you choose not to have this test.  You may need other tests to make sure you and the baby are doing well. Follow these instructions at home: Medicines  Follow your health care provider's instructions regarding medicine use. Specific medicines may be either safe or unsafe to take during pregnancy.  Take a prenatal vitamin that contains at least 600 micrograms (mcg) of folic acid.  If you develop constipation, try taking a stool softener if your health care provider approves. Eating and drinking   Eat a balanced diet that includes fresh fruits and vegetables, whole grains, good sources of protein such as meat, eggs, or tofu, and low-fat dairy. Your health care provider will help you determine the amount of weight gain that is right for you.  Avoid raw meat and uncooked cheese. These carry germs that can cause birth defects in the baby.  Eating four or five small meals rather than three large meals a day may help relieve nausea and vomiting. If you start to feel nauseous, eating a few soda crackers can be helpful. Drinking liquids between meals, instead of during meals, also seems to help ease nausea and vomiting.  Limit foods that are high in fat and processed sugars, such as fried and sweet foods.  To prevent constipation: ? Eat foods that are high in fiber, such as fresh fruits and vegetables, whole grains, and beans. ? Drink enough fluid to keep your urine clear or pale yellow. Activity  Exercise only as directed by your health care provider. Most women can continue their usual exercise routine during  pregnancy. Try to exercise for 30 minutes at least 5 days a week. Exercising will help you: ? Control your weight. ? Stay in shape. ? Be prepared for labor and delivery.  Experiencing pain or cramping in the lower abdomen or lower back is a good sign that you should stop exercising. Check with your health care provider before continuing with normal exercises.  Try to avoid standing for long periods of time. Move your legs often if you must stand in one place for a long time.  Avoid heavy lifting.  Wear low-heeled shoes and practice good posture.  You may continue to have sex unless your health care provider tells you not to. Relieving pain and discomfort  Wear a good support bra to relieve breast tenderness.  Take warm sitz baths to soothe any pain or discomfort caused by hemorrhoids. Use hemorrhoid cream if your health care provider approves.  Rest with your legs elevated if you have leg cramps or low back pain.  If you develop varicose veins in   your legs, wear support hose. Elevate your feet for 15 minutes, 3-4 times a day. Limit salt in your diet. Prenatal care  Schedule your prenatal visits by the twelfth week of pregnancy. They are usually scheduled monthly at first, then more often in the last 2 months before delivery.  Write down your questions. Take them to your prenatal visits.  Keep all your prenatal visits as told by your health care provider. This is important. Safety  Wear your seat belt at all times when driving.  Make a list of emergency phone numbers, including numbers for family, friends, the hospital, and police and fire departments. General instructions  Ask your health care provider for a referral to a local prenatal education class. Begin classes no later than the beginning of month 6 of your pregnancy.  Ask for help if you have counseling or nutritional needs during pregnancy. Your health care provider can offer advice or refer you to specialists for help  with various needs.  Do not use hot tubs, steam rooms, or saunas.  Do not douche or use tampons or scented sanitary pads.  Do not cross your legs for long periods of time.  Avoid cat litter boxes and soil used by cats. These carry germs that can cause birth defects in the baby and possibly loss of the fetus by miscarriage or stillbirth.  Avoid all smoking, herbs, alcohol, and medicines not prescribed by your health care provider. Chemicals in these products affect the formation and growth of the baby.  Do not use any products that contain nicotine or tobacco, such as cigarettes and e-cigarettes. If you need help quitting, ask your health care provider. You may receive counseling support and other resources to help you quit.  Schedule a dentist appointment. At home, brush your teeth with a soft toothbrush and be gentle when you floss. Contact a health care provider if:  You have dizziness.  You have mild pelvic cramps, pelvic pressure, or nagging pain in the abdominal area.  You have persistent nausea, vomiting, or diarrhea.  You have a bad smelling vaginal discharge.  You have pain when you urinate.  You notice increased swelling in your face, hands, legs, or ankles.  You are exposed to fifth disease or chickenpox.  You are exposed to German measles (rubella) and have never had it. Get help right away if:  You have a fever.  You are leaking fluid from your vagina.  You have spotting or bleeding from your vagina.  You have severe abdominal cramping or pain.  You have rapid weight gain or loss.  You vomit blood or material that looks like coffee grounds.  You develop a severe headache.  You have shortness of breath.  You have any kind of trauma, such as from a fall or a car accident. Summary  The first trimester of pregnancy is from week 1 until the end of week 13 (months 1 through 3).  Your body goes through many changes during pregnancy. The changes vary from  woman to woman.  You will have routine prenatal visits. During those visits, your health care provider will examine you, discuss any test results you may have, and talk with you about how you are feeling. This information is not intended to replace advice given to you by your health care provider. Make sure you discuss any questions you have with your health care provider. Document Revised: 06/07/2017 Document Reviewed: 06/06/2016 Elsevier Patient Education  2020 [employer]vier Inc.  

## 2020-05-24 NOTE — Addendum Note (Signed)
Addended by: Wendelyn Breslow L on: 05/24/2020 10:27 AM   Modules accepted: Orders

## 2020-05-26 LAB — URINE CULTURE, OB REFLEX

## 2020-05-26 LAB — CULTURE, OB URINE

## 2020-05-27 ENCOUNTER — Other Ambulatory Visit: Payer: Self-pay

## 2020-05-27 ENCOUNTER — Encounter (HOSPITAL_COMMUNITY): Payer: Self-pay | Admitting: Obstetrics and Gynecology

## 2020-05-27 ENCOUNTER — Telehealth: Payer: Self-pay

## 2020-05-27 ENCOUNTER — Inpatient Hospital Stay (HOSPITAL_COMMUNITY): Payer: BC Managed Care – PPO

## 2020-05-27 ENCOUNTER — Inpatient Hospital Stay (HOSPITAL_COMMUNITY)
Admission: AD | Admit: 2020-05-27 | Discharge: 2020-05-27 | Disposition: A | Discharge status: Home / Self Care | Payer: BC Managed Care – PPO | Attending: Obstetrics and Gynecology | Admitting: Obstetrics and Gynecology

## 2020-05-27 DIAGNOSIS — O209 Hemorrhage in early pregnancy, unspecified: Secondary | ICD-10-CM

## 2020-05-27 DIAGNOSIS — Z3A01 Less than 8 weeks gestation of pregnancy: Secondary | ICD-10-CM

## 2020-05-27 DIAGNOSIS — O208 Other hemorrhage in early pregnancy: Secondary | ICD-10-CM

## 2020-05-27 DIAGNOSIS — O039 Complete or unspecified spontaneous abortion without complication: Secondary | ICD-10-CM | POA: Diagnosis not present

## 2020-05-27 LAB — URINALYSIS, ROUTINE W REFLEX MICROSCOPIC
Bacteria, UA: NONE SEEN
Bilirubin Urine: NEGATIVE
Glucose, UA: NEGATIVE mg/dL
Ketones, ur: NEGATIVE mg/dL
Leukocytes,Ua: NEGATIVE
Nitrite: NEGATIVE
Protein, ur: NEGATIVE mg/dL
Specific Gravity, Urine: 1.009 (ref 1.005–1.030)
pH: 6 (ref 5.0–8.0)

## 2020-05-27 LAB — HCG, QUANTITATIVE, PREGNANCY: hCG, Beta Chain, Quant, S: 9549 m[IU]/mL — ABNORMAL HIGH (ref <5)

## 2020-05-27 LAB — WET PREP, GENITAL
Clue Cells Wet Prep HPF POC: NONE SEEN
Sperm: NONE SEEN
Trich, Wet Prep: NONE SEEN
Yeast Wet Prep HPF POC: NONE SEEN

## 2020-05-27 LAB — CYTOLOGY - PAP
Chlamydia: NEGATIVE
Comment: NEGATIVE
Comment: NEGATIVE
Comment: NORMAL
Diagnosis: NEGATIVE
High risk HPV: NEGATIVE
Neisseria Gonorrhea: NEGATIVE

## 2020-05-27 LAB — CBC
HCT: 38.8 % (ref 36.0–46.0)
Hemoglobin: 12.3 g/dL (ref 12.0–15.0)
MCH: 28 pg (ref 26.0–34.0)
MCHC: 31.7 g/dL (ref 30.0–36.0)
MCV: 88.4 fL (ref 80.0–100.0)
Platelets: 351 10*3/uL (ref 150–400)
RBC: 4.39 MIL/uL (ref 3.87–5.11)
RDW: 12.3 % (ref 11.5–15.5)
WBC: 12.4 10*3/uL — ABNORMAL HIGH (ref 4.0–10.5)
nRBC: 0 % (ref 0.0–0.2)

## 2020-05-27 NOTE — MAU Provider Note (Signed)
Chief Complaint: Vaginal Bleeding   First Provider Initiated Contact with Patient 05/27/20 1006      SUBJECTIVE HPI: Kerstyn Coryell is a 44 y.o. G2P1001 at 6 weeks by bedside US on 05/24/20 with gestational sac only who presents to maternity admissions reporting onset of light spotting yesterday changing to menstrual-like bleeding with clots today.  There is mild cramping associated with the pain.  Pt had hcg in the office on 05/23/20. Called [facility] for results: 13,000  Location: lower abdomen Quality: cramping Severity: 2/10 on pain scale Duration: 1 day Timing: intermittent Modifying factors: none Associated signs and symptoms: vaginal bleeding  HPI  History reviewed. No pertinent past medical history. History reviewed. No pertinent surgical history. Social History   Socioeconomic History  . Marital status: Married    Spouse name: Not on file  . Number of children: Not on file  . Years of education: Not on file  . Highest education level: Not on file  Occupational History  . Not on file  Tobacco Use  . Smoking status: Never Smoker  . Smokeless tobacco: Never Used  Vaping Use  . Vaping Use: Never used  Substance and Sexual Activity  . Alcohol use: No  . Drug use: No  . Sexual activity: Yes    Birth control/protection: None  Other Topics Concern  . Not on file  Social History Narrative  . Not on file   Social Determinants of Health   Financial Resource Strain:   . Difficulty of Paying Living Expenses: Not on file  Food Insecurity:   . Worried About Charity fundraiser in the Last Year: Not on file  . Ran Out of Food in the Last Year: Not on file  Transportation Needs:   . Lack of Transportation (Medical): Not on file  . Lack of Transportation (Non-Medical): Not on file  Physical Activity:   . Days of Exercise per Week: Not on file  . Minutes of Exercise per Session: Not on file  Stress:   . Feeling of Stress : Not on file  Social Connections:   .  Frequency of Communication with Friends and Family: Not on file  . Frequency of Social Gatherings with Friends and Family: Not on file  . Attends Religious Services: Not on file  . Active Member of Clubs or Organizations: Not on file  . Attends Archivist Meetings: Not on file  . Marital Status: Not on file  Intimate Partner Violence:   . Fear of Current or Ex-Partner: Not on file  . Emotionally Abused: Not on file  . Physically Abused: Not on file  . Sexually Abused: Not on file   No current facility-administered medications on file prior to encounter.   Current Outpatient Medications on File Prior to Encounter  Medication Sig Dispense Refill  . ascorbic acid (VITAMIN C) 500 MG tablet Take by mouth.    . Prenatal Vit-Fe Fumarate-FA (PRENATAL VITAMIN PO) Take 1 tablet by mouth daily.    Marland Kitchen acetaminophen (TYLENOL) 325 MG tablet Take 2 tablets (650 mg total) by mouth every 4 (four) hours as needed (for pain scale < 4). (Patient not taking: Reported on 05/24/2020) 30 tablet 0  . folic acid (FOLVITE) 536 MCG tablet Take 800 mcg by mouth daily.     Marland Kitchen ibuprofen (ADVIL,MOTRIN) 600 MG tablet Take 1 tablet (600 mg total) by mouth every 6 (six) hours. (Patient not taking: Reported on 05/24/2020) 30 tablet 0  . omeprazole (PRILOSEC) 40 MG capsule Take by  mouth.     No Known Allergies  ROS:  Review of Systems  Constitutional: Negative for chills, fatigue and fever.  Respiratory: Negative for shortness of breath.   Cardiovascular: Negative for chest pain.  Gastrointestinal: Positive for abdominal pain. Negative for nausea and vomiting.  Genitourinary: Positive for vaginal bleeding. Negative for difficulty urinating, dysuria, flank pain, pelvic pain, vaginal discharge and vaginal pain.  Neurological: Negative for dizziness and headaches.  Psychiatric/Behavioral: Negative.      I have reviewed patient's Past Medical Hx, Surgical Hx, Family Hx, Social Hx, medications and allergies.    Physical Exam   Patient Vitals for the past 24 hrs:  BP Temp Temp src Pulse Resp SpO2 Height Weight  05/27/20 0950 (!) 97/44 98.1 F (36.7 C) Oral 67 18 96 % -- --  05/27/20 0932 (!) 94/53 98.2 F (36.8 C) Oral 73 20 100 % -- --  05/27/20 0926 -- -- -- -- -- -- 5\' 6"  (1.676 m) 67.3 kg   Constitutional: Well-developed, well-nourished female in no acute distress.  Cardiovascular: normal rate Respiratory: normal effort GI: Abd soft, non-tender. Pos BS x 4 MS: Extremities nontender, no edema, normal ROM Neurologic: Alert and oriented x 4.  GU: Neg CVAT.  PELVIC EXAM: Cervix pink, visually closed, without lesion, small amount dark red bleeding, no fox swab required to visualize cervix, vaginal walls and external genitalia normal Bimanual exam: Cervix 0/long/high, firm, anterior, neg CMT, uterus nontender, nonenlarged, adnexa without tenderness, enlargement, or mass    LAB RESULTS No results found for this or any previous visit (from the past 24 hour(s)).     Results for orders placed or performed during the hospital encounter of 05/27/20 (from the past 48 hour(s))  Urinalysis, Routine w reflex microscopic Urine, Clean Catch     Status: Abnormal   Collection Time: 05/27/20  9:50 AM  Result Value Ref Range   Color, Urine STRAW (A) YELLOW   APPearance CLEAR CLEAR   Specific Gravity, Urine 1.009 1.005 - 1.030   pH 6.0 5.0 - 8.0   Glucose, UA NEGATIVE NEGATIVE mg/dL   Hgb urine dipstick MODERATE (A) NEGATIVE   Bilirubin Urine NEGATIVE NEGATIVE   Ketones, ur NEGATIVE NEGATIVE mg/dL   Protein, ur NEGATIVE NEGATIVE mg/dL   Nitrite NEGATIVE NEGATIVE   Leukocytes,Ua NEGATIVE NEGATIVE   RBC / HPF 21-50 0 - 5 RBC/hpf   WBC, UA 0-5 0 - 5 WBC/hpf   Bacteria, UA NONE SEEN NONE SEEN   Squamous Epithelial / LPF 0-5 0 - 5   Mucus PRESENT     Comment: Performed at [facility], [place] [address] [place], [place] [address]  CBC     Status: Abnormal   Collection Time: 05/27/20 10:13 AM   Result Value Ref Range   WBC 12.4 (H) 4.0 - 10.5 K/uL   RBC 4.39 3.87 - 5.11 MIL/uL   Hemoglobin 12.3 12.0 - 15.0 g/dL   HCT 38.8 36 - 46 %   MCV 88.4 80.0 - 100.0 fL   MCH 28.0 26.0 - 34.0 pg   MCHC 31.7 30.0 - 36.0 g/dL   RDW 12.3 11.5 - 15.5 %   Platelets 351 150 - 400 K/uL   nRBC 0.0 0.0 - 0.2 %    Comment: Performed at [facility], [place] [address], [place], [place] [address]  hCG, quantitative, pregnancy     Status: Abnormal   Collection Time: 05/27/20 10:13 AM  Result Value Ref Range   hCG, Beta  Chain, Quant, S 9,549 (H) <5 mIU/mL    Comment:          GEST. AGE      CONC.  (mIU/mL)   <=1 WEEK        5 - 50     2 WEEKS       50 - 500     3 WEEKS       100 - 10,000     4 WEEKS     1,000 - 30,000     5 WEEKS     3,500 - 115,000   6-8 WEEKS     12,000 - 270,000    12 WEEKS     15,000 - 220,000        FEMALE AND NON-PREGNANT FEMALE:     LESS THAN 5 mIU/mL Performed at [facility], [place] [address], [place], [place] [address]   Wet prep, genital     Status: Abnormal   Collection Time: 05/27/20 10:35 AM   Specimen: Cervix  Result Value Ref Range   Yeast Wet Prep HPF POC NONE SEEN NONE SEEN   Trich, Wet Prep NONE SEEN NONE SEEN   Clue Cells Wet Prep HPF POC NONE SEEN NONE SEEN   WBC, Wet Prep HPF POC MODERATE (A) NONE SEEN   Sperm NONE SEEN     Comment: Performed at [facility], [place] [address] [place], [place] [address]    IMAGING No results found. US OB LESS THAN 14 WEEKS WITH OB TRANSVAGINAL  Result Date: 05/27/2020 CLINICAL DATA:  Vaginal bleeding. Patient noted to be pregnant with quantitative beta HCG 9,549. History states ultrasound in office recently on 05/24/2020 demonstrates IUP with gestational age [redacted] weeks 4 days. Estimated gestational age per LMP 10 weeks 3 days. EXAM: OBSTETRIC <14 WK [place] AND TRANSVAGINAL OB US TECHNIQUE: Both transabdominal and transvaginal ultrasound examinations were performed for complete evaluation of the gestation as  well as the maternal uterus, adnexal regions, and pelvic cul-de-sac. Transvaginal technique was performed to assess early pregnancy. COMPARISON:  None. FINDINGS: Intrauterine gestational sac: Not visualized. Yolk sac:  Not visualized. Embryo:  Not visualized. Cardiac Activity: Not visualized. Heart Rate: Not visualized.  Bpm MSD: Not visualized. CRL:  Not visualized Subchorionic hemorrhage:  None visualized. Maternal uterus/adnexae: Sagittal images demonstrate mild thickening and ill definition of the endometrium. Ovaries are not visualized. Small amount of free fluid in the pelvis. IMPRESSION: No IUP visualized. No focal adnexal abnormality. Correlating with patient's history and quantitative beta hCG, findings are likely due to failed pregnancy. Ectopic pregnancy is also a possibility as recommend direct correlation with patient's outside images and follow-up serial quantitative beta HCG and ultrasound as indicated. Electronically Signed   By: Marin Olp M.D.   On: 05/27/2020 12:44   MAU Management/MDM: Orders Placed This Encounter  Procedures  . Wet prep, genital  . US OB LESS THAN 14 WEEKS WITH OB TRANSVAGINAL  . Urinalysis, Routine w reflex microscopic Urine, Clean Catch  . CBC  . hCG, quantitative, pregnancy    No orders of the defined types were placed in this encounter.   Given drop in hcg from office visit on 11/16 to today and [place] with gestational sac that is no longer visible, likely failed pregnancy.  Bleeding is minimal and pt stable today. F/U in [facility] office in 1 week for hcg, in 2 weeks for provider visit. Bleeding/miscarriage precautions given today.   Return to MAU for emergencies.  ASSESSMENT  1. SAB (spontaneous abortion)   2. Vaginal bleeding in pregnancy, first trimester     PLAN Discharge home with bleeding precautions   Fatima Blank Certified Nurse-Midwife 05/27/2020  10:06 AM

## 2020-05-27 NOTE — Telephone Encounter (Signed)
Patient called and is approx [redacted] weeks pregnant. Patient states that she had some bleeding yesterday that was dark brown but this morning it has turned to bright red and she has already changed two pads in the last hour.  Patient instructed to go and be evaluated at Maternity Admissions unit at [facility]. Address given and instructed to go to entrance C. Patient states understanding. [name] RN

## 2020-05-27 NOTE — MAU Note (Signed)
Presents with c/o VB that began this morning, states wearing sanitary napkin.  Reports has passed large clots.  LMP second week of September.  States had [facility] appointment last week [facility] [employer] [facility].

## 2020-05-30 LAB — GC/CHLAMYDIA PROBE AMP (~~LOC~~) NOT AT ARMC
Chlamydia: NEGATIVE
Comment: NEGATIVE
Comment: NORMAL
Neisseria Gonorrhea: NEGATIVE

## 2020-06-01 ENCOUNTER — Other Ambulatory Visit: Payer: Self-pay

## 2020-06-01 ENCOUNTER — Other Ambulatory Visit: Payer: BC Managed Care – PPO

## 2020-06-01 DIAGNOSIS — O039 Complete or unspecified spontaneous abortion without complication: Secondary | ICD-10-CM

## 2020-06-01 NOTE — Progress Notes (Signed)
Patient sent to lab for blood draw. Lillyan Hitson RN 

## 2020-06-02 LAB — CBC/D/PLT+RPR+RH+ABO+RUB AB...
Antibody Screen: NEGATIVE
Basophils Absolute: 0.1 10*3/uL (ref 0.0–0.2)
Basos: 1 %
EOS (ABSOLUTE): 0.3 10*3/uL (ref 0.0–0.4)
Eos: 2 %
HCV Ab: 0.1 s/co ratio (ref 0.0–0.9)
HIV Screen 4th Generation wRfx: NONREACTIVE
Hematocrit: 46.2 % (ref 34.0–46.6)
Hemoglobin: 15 g/dL (ref 11.1–15.9)
Hepatitis B Surface Ag: NEGATIVE
Immature Grans (Abs): 0.1 10*3/uL (ref 0.0–0.1)
Immature Granulocytes: 1 %
Lymphocytes Absolute: 2.7 10*3/uL (ref 0.7–3.1)
Lymphs: 23 %
MCH: 28 pg (ref 26.6–33.0)
MCHC: 32.5 g/dL (ref 31.5–35.7)
MCV: 86 fL (ref 79–97)
Monocytes Absolute: 0.6 10*3/uL (ref 0.1–0.9)
Monocytes: 6 %
Neutrophils Absolute: 7.6 10*3/uL — ABNORMAL HIGH (ref 1.4–7.0)
Neutrophils: 67 %
Platelets: 390 10*3/uL (ref 150–450)
RBC: 5.35 x10E6/uL — ABNORMAL HIGH (ref 3.77–5.28)
RDW: 12.5 % (ref 11.7–15.4)
RPR Ser Ql: NONREACTIVE
Rh Factor: POSITIVE
Rubella Antibodies, IGG: 24.8 index (ref >0.99)
WBC: 11.3 10*3/uL — ABNORMAL HIGH (ref 3.4–10.8)

## 2020-06-02 LAB — SMN1 COPY NUMBER ANALYSIS (SMA CARRIER SCREENING)

## 2020-06-02 LAB — BETA HCG QUANT (REF LAB)
hCG Quant: 13038 m[IU]/mL
hCG Quant: 328 m[IU]/mL

## 2020-06-02 LAB — HCV INTERPRETATION

## 2020-06-09 ENCOUNTER — Encounter: Payer: Self-pay | Admitting: Family Medicine

## 2020-06-09 ENCOUNTER — Other Ambulatory Visit: Payer: Self-pay

## 2020-06-09 ENCOUNTER — Ambulatory Visit (INDEPENDENT_AMBULATORY_CARE_PROVIDER_SITE_OTHER): Payer: BC Managed Care – PPO | Admitting: Family Medicine

## 2020-06-09 VITALS — BP 101/48 | HR 63 | Ht 66.0 in | Wt 147.0 lb

## 2020-06-09 DIAGNOSIS — O039 Complete or unspecified spontaneous abortion without complication: Secondary | ICD-10-CM | POA: Diagnosis not present

## 2020-06-09 NOTE — Progress Notes (Signed)
   Subjective:    Patient ID: Olivia Salinas, female    DOB: 21-Oct-1975, 44 y.o.   MRN: [id number]  HPI Patient seen for follow up of miscarriage. She was seen here for initial OB appt on 11/16 and was approximately [redacted]wks pregnant, but only gestational sac was seen on [place] in office. Was seen in MAU for bleeding on 11/19 - had no visualization of gestational sac and had drop in HCG. Had rpt hcg on 11/24, which further dropped. No longer bleeding or cramping.   Review of Systems     Objective:   Physical Exam Vitals reviewed.  Constitutional:      Appearance: Normal appearance.  Skin:    General: Skin is warm.     Capillary Refill: Capillary refill takes less than 2 seconds.  Neurological:     Mental Status: She is alert.  Psychiatric:        Mood and Affect: Mood normal.        Behavior: Behavior normal.        Thought Content: Thought content normal.        Judgment: Judgment normal.        Assessment & Plan:  1. SAB (spontaneous abortion) Miscarriages are common in this age range - approximately 25% of pregnancies end in a miscarriage. This was a desired pregnancy. Is looking at getting pregnant again, once cleared. Advised that she should wait 3 months prior to attempting to get pregnant. Continue PNV. Return when pregnant, if doesn't get menses in 6-8 weeks, or unable to conceive after 6 months of trying.

## 2020-06-09 NOTE — Progress Notes (Signed)
Patient reports she is no longer bleeding. [name] RN

## 2020-06-22 ENCOUNTER — Encounter: Payer: BC Managed Care – PPO | Admitting: Obstetrics & Gynecology

## 2020-07-09 NOTE — L&D Delivery Note (Addendum)
OB/GYN Faculty Practice Delivery Note  Olivia Salinas is a 45 y.o. G3P1011 s/p SVD at [redacted]w[redacted]d. She was admitted for spontaneous onset of labor.   ROM: 0h 65m with clear fluid GBS Status: negative Maximum Maternal Temperature: 98.1 F  Delivery Date/Time: 05/24/2021 at 1348 Delivery: Called to room and patient was complete and pushing. Head delivered. No nuchal cord present. Shoulder and body delivered in usual fashion. Infant with spontaneous cry, placed on mother's abdomen, dried and stimulated. Cord clamped x 2 after 1-minute delay, and cut by FOB. Cord blood drawn. Placenta delivered spontaneously with gentle cord traction. Fundus firm with massage and Pitocin. Labia, perineum, vagina, and cervix inspected inspected with second degree perineal laceration.   Placenta: Intact, 3VC Complications: None Lacerations: 2nd degree perineal, repaired with 3-0 monocryl  EBL: 300 cc Analgesia: IV Fentanyl   Infant: Viable  APGARs 9 & 9  3330g  Orvis Brill, DO [facility] PGY-1 [facility] 05/24/2021, 2:30 PM   Midwife attestation: I was gloved and present for delivery in its entirety and I agree with the above resident's note.  Fatima Blank, CNM 10:04 AM

## 2020-11-21 ENCOUNTER — Other Ambulatory Visit: Payer: Self-pay

## 2020-11-21 ENCOUNTER — Encounter: Payer: BC Managed Care – PPO | Admitting: Family Medicine

## 2020-11-29 ENCOUNTER — Ambulatory Visit (INDEPENDENT_AMBULATORY_CARE_PROVIDER_SITE_OTHER): Payer: BC Managed Care – PPO | Admitting: Medical

## 2020-11-29 ENCOUNTER — Encounter: Payer: Self-pay | Admitting: Medical

## 2020-11-29 ENCOUNTER — Other Ambulatory Visit: Payer: Self-pay

## 2020-11-29 VITALS — BP 95/63 | HR 84 | Wt 145.0 lb

## 2020-11-29 DIAGNOSIS — Z3A14 14 weeks gestation of pregnancy: Secondary | ICD-10-CM

## 2020-11-29 DIAGNOSIS — O09899 Supervision of other high risk pregnancies, unspecified trimester: Secondary | ICD-10-CM

## 2020-11-29 DIAGNOSIS — O09529 Supervision of elderly multigravida, unspecified trimester: Secondary | ICD-10-CM

## 2020-11-29 NOTE — Patient Instructions (Signed)

## 2020-11-29 NOTE — Progress Notes (Signed)
[id number]

## 2020-11-29 NOTE — Progress Notes (Signed)
   PRENATAL VISIT NOTE  Subjective:  Olivia Salinas is a 45 y.o. G3P1011 at [redacted]w[redacted]d being seen today for her first prenatal visit for this pregnancy.  She is currently monitored for the following issues for this high-risk pregnancy and has Liver cyst; Supervision of other high risk pregnancy, antepartum, unspecified trimester; and AMA (advanced maternal age) multigravida 35+ on their problem list.  Patient reports no complaints.  Contractions: Not present. Vag. Bleeding: None.  Movement: Absent. Denies leaking of fluid.   She is planning to breastfeed. Desires contraception, unsure of type.  The following portions of the patient's history were reviewed and updated as appropriate: allergies, current medications, past family history, past medical history, past social history, past surgical history and problem list.   Objective:   Vitals:   11/29/20 0839  BP: 95/63  Pulse: 84  Weight: 145 lb (65.8 kg)    Fetal Status: Fetal Heart Rate (bpm): 164   Movement: Absent     General:  Alert, oriented and cooperative. Patient is in no acute distress.  Skin: Skin is warm and dry. No rash noted.   Cardiovascular: Normal heart rate and rhythm noted  Respiratory: Normal respiratory effort, no problems with respiration noted. Clear to auscultation.   Abdomen: Soft, gravid, appropriate for gestational age. Normal bowel sounds. Non-tender. Pain/Pressure: Absent     Pelvic: Cervical exam performed Dilation: Closed Effacement (%): Thick   Breast: Symmetric, no masses or discharge noted.   Extremities: Normal range of motion.  Edema: None  Mental Status: Normal mood and affect. Normal behavior. Normal judgment and thought content.   Assessment and Plan:  Pregnancy: G3P1011 at 105w1d 1. Supervision of other high risk pregnancy, antepartum, unspecified trimester - Culture, OB Urine - CBC/D/Plt+RPR+Rh+ABO+RubIgG... - [place] MFM OB DETAIL +14 WK; Future - GC/Chlamydia probe amp ([place])not at [facility] -  Normal pap 05/24/20 - Patient will call [employer] for cost information prior to opting in to genetic testing, will review at next visit   2. Antepartum multigravida of advanced maternal age - Discussed antepartum testing for AMA as well as delivery timing   Preterm labor/second trimester warning symptoms and general obstetric precautions including but not limited to vaginal bleeding, contractions, leaking of fluid and fetal movement were reviewed in detail with the patient. Please refer to After Visit Summary for other counseling recommendations.   Discussed the normal visit cadence for prenatal care Discussed the nature of our practice with multiple providers including residents and students    Future Appointments  Date Time Provider [place]  12/28/2020  9:00 AM Lavonia Drafts, MD [facility] None  01/02/2021  8:15 AM [facility]-M[facility] NURSE [facility]  01/02/2021  8:30 AM [facility]-M[facility] US3 [facility]  01/26/2021  8:15 AM Nehemiah Settle, Tanna Savoy, DO [facility] None    Kerry Hough, PA-C

## 2020-11-30 LAB — GC/CHLAMYDIA PROBE AMP (~~LOC~~) NOT AT ARMC
Chlamydia: NEGATIVE
Comment: NEGATIVE
Comment: NORMAL
Neisseria Gonorrhea: NEGATIVE

## 2020-12-01 LAB — CBC/D/PLT+RPR+RH+ABO+RUBIGG...
Antibody Screen: NEGATIVE
Basophils Absolute: 0.1 10*3/uL (ref 0.0–0.2)
Basos: 1 %
EOS (ABSOLUTE): 0.3 10*3/uL (ref 0.0–0.4)
Eos: 2 %
HCV Ab: <0.1 s/co ratio (ref 0.0–0.9)
HIV Screen 4th Generation wRfx: NONREACTIVE
Hematocrit: 39.8 % (ref 34.0–46.6)
Hemoglobin: 13.1 g/dL (ref 11.1–15.9)
Hepatitis B Surface Ag: NEGATIVE
Immature Grans (Abs): 0.1 10*3/uL (ref 0.0–0.1)
Immature Granulocytes: 1 %
Lymphocytes Absolute: 2.6 10*3/uL (ref 0.7–3.1)
Lymphs: 20 %
MCH: 28.4 pg (ref 26.6–33.0)
MCHC: 32.9 g/dL (ref 31.5–35.7)
MCV: 86 fL (ref 79–97)
Monocytes Absolute: 0.8 10*3/uL (ref 0.1–0.9)
Monocytes: 6 %
Neutrophils Absolute: 9.2 10*3/uL — ABNORMAL HIGH (ref 1.4–7.0)
Neutrophils: 70 %
Platelets: 336 10*3/uL (ref 150–450)
RBC: 4.61 x10E6/uL (ref 3.77–5.28)
RDW: 13.2 % (ref 11.7–15.4)
RPR Ser Ql: NONREACTIVE
Rh Factor: POSITIVE
Rubella Antibodies, IGG: 22.3 index (ref >0.99)
WBC: 13.1 10*3/uL — ABNORMAL HIGH (ref 3.4–10.8)

## 2020-12-01 LAB — CULTURE, OB URINE

## 2020-12-01 LAB — URINE CULTURE, OB REFLEX

## 2020-12-01 LAB — HCV INTERPRETATION

## 2020-12-28 ENCOUNTER — Ambulatory Visit (INDEPENDENT_AMBULATORY_CARE_PROVIDER_SITE_OTHER): Payer: BC Managed Care – PPO | Admitting: Obstetrics & Gynecology

## 2020-12-28 ENCOUNTER — Other Ambulatory Visit: Payer: Self-pay

## 2020-12-28 ENCOUNTER — Encounter: Payer: Self-pay | Admitting: Obstetrics & Gynecology

## 2020-12-28 VITALS — BP 89/52 | HR 75 | Wt 148.0 lb

## 2020-12-28 DIAGNOSIS — Z3A18 18 weeks gestation of pregnancy: Secondary | ICD-10-CM

## 2020-12-28 DIAGNOSIS — O09899 Supervision of other high risk pregnancies, unspecified trimester: Secondary | ICD-10-CM

## 2020-12-28 DIAGNOSIS — O09529 Supervision of elderly multigravida, unspecified trimester: Secondary | ICD-10-CM

## 2020-12-28 NOTE — Patient Instructions (Signed)

## 2020-12-28 NOTE — Progress Notes (Signed)
   PRENATAL VISIT NOTE  Subjective:  Olivia Salinas is a 45 y.o. G3P1011 at [redacted]w[redacted]d being seen today for ongoing prenatal care.  She is currently monitored for the following issues for this low-risk pregnancy and has Liver cyst; Supervision of other high risk pregnancy, antepartum, unspecified trimester; and AMA (advanced maternal age) multigravida 35+ on their problem list.  Patient reports no complaints.  Contractions: Not present. Vag. Bleeding: None.  Movement: Absent. Denies leaking of fluid.   The following portions of the patient's history were reviewed and updated as appropriate: allergies, current medications, past family history, past medical history, past social history, past surgical history and problem list.   Objective:   Vitals:   12/28/20 0912  BP: (!) 89/52  Pulse: 75  Weight: 148 lb (67.1 kg)    Fetal Status: Fetal Heart Rate (bpm): 164   Movement: Absent     General:  Alert, oriented and cooperative. Patient is in no acute distress.  Skin: Skin is warm and dry. No rash noted.   Cardiovascular: Normal heart rate noted  Respiratory: Normal respiratory effort, no problems with respiration noted  Abdomen: Soft, gravid, appropriate for gestational age.  Pain/Pressure: Present     Pelvic: Cervical exam deferred        Extremities: Normal range of motion.  Edema: None  Mental Status: Normal mood and affect. Normal behavior. Normal judgment and thought content.   Assessment and Plan:  Pregnancy: G3P1011 at [redacted]w[redacted]d 1. [redacted] weeks gestation of pregnancy FHR WNL Declines genetic testing. After discussion she will think it over some more.   2. Supervision of other high risk pregnancy, antepartum, unspecified trimester  3. Antepartum multigravida of advanced maternal age Rec baby ASA daily.    Preterm labor symptoms and general obstetric precautions including but not limited to vaginal bleeding, contractions, leaking of fluid and fetal movement were reviewed in detail with  the patient. Please refer to After Visit Summary for other counseling recommendations.   Return in about 4 weeks (around 01/25/2021).  Future Appointments  Date Time Provider [place]  01/02/2021  8:15 AM [facility] NURSE [facility]  01/02/2021  8:30 AM WMC-MFC US3 [facility]US [facility]  01/26/2021  8:15 AM Stinson, Tanna Savoy, DO [facility] None    Lavonia Drafts, MD

## 2021-01-02 ENCOUNTER — Ambulatory Visit: Payer: BC Managed Care – PPO | Admitting: *Deleted

## 2021-01-02 ENCOUNTER — Encounter: Payer: Self-pay | Admitting: *Deleted

## 2021-01-02 ENCOUNTER — Other Ambulatory Visit: Payer: Self-pay

## 2021-01-02 ENCOUNTER — Ambulatory Visit: Payer: BC Managed Care – PPO | Attending: Medical

## 2021-01-02 VITALS — BP 96/42 | HR 81

## 2021-01-02 DIAGNOSIS — O09899 Supervision of other high risk pregnancies, unspecified trimester: Secondary | ICD-10-CM | POA: Insufficient documentation

## 2021-01-26 ENCOUNTER — Encounter: Payer: BC Managed Care – PPO | Admitting: Family Medicine

## 2021-01-30 ENCOUNTER — Encounter: Payer: Self-pay | Admitting: *Deleted

## 2021-01-30 ENCOUNTER — Ambulatory Visit: Payer: BC Managed Care – PPO | Attending: Obstetrics

## 2021-01-30 ENCOUNTER — Other Ambulatory Visit: Payer: Self-pay

## 2021-01-30 ENCOUNTER — Other Ambulatory Visit: Payer: Self-pay | Admitting: *Deleted

## 2021-01-30 ENCOUNTER — Ambulatory Visit: Payer: BC Managed Care – PPO | Admitting: *Deleted

## 2021-01-30 VITALS — BP 93/47 | HR 67

## 2021-01-30 DIAGNOSIS — O09522 Supervision of elderly multigravida, second trimester: Secondary | ICD-10-CM

## 2021-01-30 DIAGNOSIS — O09899 Supervision of other high risk pregnancies, unspecified trimester: Secondary | ICD-10-CM

## 2021-02-09 ENCOUNTER — Encounter: Payer: BC Managed Care – PPO | Admitting: Obstetrics and Gynecology

## 2021-02-20 ENCOUNTER — Ambulatory Visit (INDEPENDENT_AMBULATORY_CARE_PROVIDER_SITE_OTHER): Payer: BC Managed Care – PPO | Admitting: Obstetrics and Gynecology

## 2021-02-20 ENCOUNTER — Other Ambulatory Visit: Payer: Self-pay

## 2021-02-20 VITALS — BP 96/47 | Wt 159.0 lb

## 2021-02-20 DIAGNOSIS — O09522 Supervision of elderly multigravida, second trimester: Secondary | ICD-10-CM

## 2021-02-20 DIAGNOSIS — K7689 Other specified diseases of liver: Secondary | ICD-10-CM

## 2021-02-20 DIAGNOSIS — Z3A26 26 weeks gestation of pregnancy: Secondary | ICD-10-CM

## 2021-02-20 DIAGNOSIS — O09899 Supervision of other high risk pregnancies, unspecified trimester: Secondary | ICD-10-CM

## 2021-02-20 NOTE — Progress Notes (Signed)
     PRENATAL VISIT NOTE  Subjective:  Olivia Salinas is a 45 y.o. G3P1011 at 23w0dbeing seen today for ongoing prenatal care.  She is currently monitored for the following issues for this high-risk pregnancy and has Liver cyst; Supervision of other high risk pregnancy, antepartum, unspecified trimester; and AMA (advanced maternal age) multigravida 35+ on their problem list.  Patient reports no complaints.  Contractions: Not present. Vag. Bleeding: None.  Movement: Present. Denies leaking of fluid.   The following portions of the patient's history were reviewed and updated as appropriate: allergies, current medications, past family history, past medical history, past social history, past surgical history and problem list.   Objective:   Vitals:   02/20/21 1044  BP: (!) 96/47  Weight: 159 lb (72.1 kg)    Fetal Status: Fetal Heart Rate (bpm): 158   Movement: Present     General:  Alert, oriented and cooperative. Patient is in no acute distress.  Skin: Skin is warm and dry. No rash noted.   Cardiovascular: Normal heart rate noted  Respiratory: Normal respiratory effort, no problems with respiration noted  Abdomen: Soft, gravid, appropriate for gestational age.  Pain/Pressure: Present     Pelvic: Cervical exam deferred        Extremities: Normal range of motion.  Edema: None  Mental Status: Normal mood and affect. Normal behavior. Normal judgment and thought content.   Assessment and Plan:  Pregnancy: G3P1011 at 278w0d. [redacted] weeks gestation of pregnancy  2. Liver cyst 1cm benign appearing right hepatic lobe cyst seen by GI on 04/01/2018 ruq pain eval. Pt believes there was nothing else to do for follow up. Will CC note to past GI MD to see if anything else needs to be done. Pt is asymptomatic.   3. Supervision of other high risk pregnancy, antepartum, unspecified trimester 28wk labs nv  4. Multigravida of advanced maternal age in second trimester 7/25: 571g, ac 43% Rpt u/s on  9/26 I d/w her re: mfm rec for 36wk qwk testing and recommend delivery at 39-40wks  Preterm labor symptoms and general obstetric precautions including but not limited to vaginal bleeding, contractions, leaking of fluid and fetal movement were reviewed in detail with the patient. Please refer to After Visit Summary for other counseling recommendations.   Return in about 2 weeks (around 03/06/2021) for low risk ob, md or app, fasting 2hr GTT.  Future Appointments  Date Time Provider [place]9/26/2022  8:30 AM [facility]9/26/2022  8:45 AM WMC-MFC US4 [facility]US [facility]    ChAletha HalimMD

## 2021-03-06 ENCOUNTER — Other Ambulatory Visit: Payer: Self-pay

## 2021-03-06 ENCOUNTER — Encounter: Payer: Self-pay | Admitting: Obstetrics and Gynecology

## 2021-03-06 ENCOUNTER — Ambulatory Visit (INDEPENDENT_AMBULATORY_CARE_PROVIDER_SITE_OTHER): Payer: BC Managed Care – PPO | Admitting: Obstetrics and Gynecology

## 2021-03-06 VITALS — BP 89/47 | HR 76 | Wt 160.0 lb

## 2021-03-06 DIAGNOSIS — Z8759 Personal history of other complications of pregnancy, childbirth and the puerperium: Secondary | ICD-10-CM

## 2021-03-06 DIAGNOSIS — K7689 Other specified diseases of liver: Secondary | ICD-10-CM

## 2021-03-06 DIAGNOSIS — Z3A28 28 weeks gestation of pregnancy: Secondary | ICD-10-CM

## 2021-03-06 DIAGNOSIS — O09899 Supervision of other high risk pregnancies, unspecified trimester: Secondary | ICD-10-CM

## 2021-03-06 DIAGNOSIS — Z23 Encounter for immunization: Secondary | ICD-10-CM | POA: Diagnosis not present

## 2021-03-06 DIAGNOSIS — O09523 Supervision of elderly multigravida, third trimester: Secondary | ICD-10-CM

## 2021-03-06 DIAGNOSIS — Z8719 Personal history of other diseases of the digestive system: Secondary | ICD-10-CM | POA: Insufficient documentation

## 2021-03-06 NOTE — Progress Notes (Signed)
   PRENATAL VISIT NOTE  Subjective:  Olivia Salinas is a 45 y.o. G3P1011 at 54w0dbeing seen today for ongoing prenatal care.  She is currently monitored for the following issues for this low-risk pregnancy and has Liver cyst; Supervision of other high risk pregnancy, antepartum, unspecified trimester; AMA (advanced maternal age) multigravida 35+; and History of cholestasis during pregnancy on their problem list.  Patient reports backache, no bleeding, no contractions, no cramping, and no leaking.  Contractions: Not present. Vag. Bleeding: None.  Movement: Present. Denies leaking of fluid.   The following portions of the patient's history were reviewed and updated as appropriate: allergies, current medications, past family history, past medical history, past social history, past surgical history and problem list.   Objective:   Vitals:   03/06/21 0840  BP: (!) 89/47  Pulse: 76  Weight: 160 lb (72.6 kg)    Fetal Status: Fetal Heart Rate (bpm): 153 Fundal Height: 28 cm Movement: Present     General:  Alert, oriented and cooperative. Patient is in no acute distress.  Skin: Skin is warm and dry. No rash noted.   Cardiovascular: Normal heart rate noted  Respiratory: Normal respiratory effort, no problems with respiration noted  Abdomen: Soft, gravid, appropriate for gestational age.  Pain/Pressure: Absent     Pelvic: Cervical exam deferred        Extremities: Normal range of motion.  Edema: Trace  Mental Status: Normal mood and affect. Normal behavior. Normal judgment and thought content.   Assessment and Plan:  Pregnancy: G3P1011 at 262w0d. [redacted] weeks gestation of pregnancy - f/u in 2 weeks, FM reviewed. Going for blood work today.  - CBC - Glucose Tolerance, 2 Hours w/1 Hour - HIV Antibody (routine testing w rflx) - RPR - Tdap vaccine greater than or equal to 7yo IM  2. Supervision of other high risk pregnancy, antepartum, unspecified trimester - she is taking her ldASA  intermittently  3. Multigravida of advanced maternal age in third trimester - Discussed delivery at 3993-40 weeks Has USKorean 9/26  4. Liver cyst - F/u with PCP following pregnancy  5. History of cholestasis during pregnancy - Was delivered previously at 37Triplettor this. Discussed s/sx and to call usKoreaf symptoms present this time. Discussed may or may not recur but she is at higher risk due to prior history.    Preterm labor symptoms and general obstetric precautions including but not limited to vaginal bleeding, contractions, leaking of fluid and fetal movement were reviewed in detail with the patient. Please refer to After Visit Summary for other counseling recommendations.   Return in about 2 weeks (around 03/20/2021) for OB VISIT, MD or APP.  Future Appointments  Date Time Provider [place]9/13/2022  8:35 AM WiSeabron SpatesCNM C[facility]-WM[facility] None  04/03/2021  8:30 AM WMC-MFC NURSE [facility]9/26/2022  8:45 AM WMC-MFC US4 [facility]9/28/2022  8:35 AM StNehemiah SettleJaTanna SavoyDO CWH-WM[facility] None    PaRadene GunningMD

## 2021-03-07 LAB — CBC
Hematocrit: 36.5 % (ref 34.0–46.6)
Hemoglobin: 12.3 g/dL (ref 11.1–15.9)
MCH: 29.6 pg (ref 26.6–33.0)
MCHC: 33.7 g/dL (ref 31.5–35.7)
MCV: 88 fL (ref 79–97)
Platelets: 286 10*3/uL (ref 150–450)
RBC: 4.15 x10E6/uL (ref 3.77–5.28)
RDW: 11.8 % (ref 11.7–15.4)
WBC: 12.3 10*3/uL — ABNORMAL HIGH (ref 3.4–10.8)

## 2021-03-07 LAB — GLUCOSE TOLERANCE, 2 HOURS W/ 1HR
Glucose, 1 hour: 108 mg/dL (ref 65–179)
Glucose, 2 hour: 106 mg/dL (ref 65–152)
Glucose, Fasting: 71 mg/dL (ref 65–91)

## 2021-03-07 LAB — RPR: RPR Ser Ql: NONREACTIVE

## 2021-03-07 LAB — HIV ANTIBODY (ROUTINE TESTING W REFLEX): HIV Screen 4th Generation wRfx: NONREACTIVE

## 2021-03-21 ENCOUNTER — Encounter: Payer: Self-pay | Admitting: Advanced Practice Midwife

## 2021-03-21 ENCOUNTER — Other Ambulatory Visit: Payer: Self-pay

## 2021-03-21 ENCOUNTER — Ambulatory Visit (INDEPENDENT_AMBULATORY_CARE_PROVIDER_SITE_OTHER): Payer: BC Managed Care – PPO | Admitting: Advanced Practice Midwife

## 2021-03-21 VITALS — BP 95/53 | HR 78 | Wt 163.0 lb

## 2021-03-21 DIAGNOSIS — D1800 Hemangioma unspecified site: Secondary | ICD-10-CM | POA: Insufficient documentation

## 2021-03-21 DIAGNOSIS — B079 Viral wart, unspecified: Secondary | ICD-10-CM | POA: Insufficient documentation

## 2021-03-21 DIAGNOSIS — Z8759 Personal history of other complications of pregnancy, childbirth and the puerperium: Secondary | ICD-10-CM

## 2021-03-21 DIAGNOSIS — Z8719 Personal history of other diseases of the digestive system: Secondary | ICD-10-CM

## 2021-03-21 DIAGNOSIS — D485 Neoplasm of uncertain behavior of skin: Secondary | ICD-10-CM | POA: Insufficient documentation

## 2021-03-21 DIAGNOSIS — L918 Other hypertrophic disorders of the skin: Secondary | ICD-10-CM | POA: Insufficient documentation

## 2021-03-21 DIAGNOSIS — Z3A30 30 weeks gestation of pregnancy: Secondary | ICD-10-CM

## 2021-03-21 NOTE — Progress Notes (Signed)
   PRENATAL VISIT NOTE  Subjective:  Olivia Salinas is a 45 y.o. G3P1011 at 68w1dbeing seen today for ongoing prenatal care.  She is currently monitored for the following issues for this low-risk pregnancy and has Liver cyst; Supervision of other high risk pregnancy, antepartum, unspecified trimester; AMA (advanced maternal age) multigravida 35+; History of cholestasis during pregnancy; Hemangioma; Neoplasm of uncertain behavior of skin; Skin tag; and Viral warts on their problem list.  Patient reports no complaints.  Contractions: Irritability. Vag. Bleeding: None.  Movement: Present. Denies leaking of fluid.   The following portions of the patient's history were reviewed and updated as appropriate: allergies, current medications, past family history, past medical history, past social history, past surgical history and problem list.   Objective:   Vitals:   03/21/21 0852  BP: (!) 95/53  Pulse: 78  Weight: 163 lb (73.9 kg)    Fetal Status: Fetal Heart Rate (bpm): 135   Movement: Present     General:  Alert, oriented and cooperative. Patient is in no acute distress.  Skin: Skin is warm and dry. No rash noted.   Cardiovascular: Normal heart rate noted  Respiratory: Normal respiratory effort, no problems with respiration noted  Abdomen: Soft, gravid, appropriate for gestational age.  Pain/Pressure: Present     Pelvic: Cervical exam deferred        Extremities: Normal range of motion.  Edema: Trace  Mental Status: Normal mood and affect. Normal behavior. Normal judgment and thought content.   Assessment and Plan:  Pregnancy: G3P1011 at 374w1d. [redacted] weeks gestation of pregnancy     Glucose test normal   2. History of cholestasis during pregnancy     No symptoms currently  Preterm labor symptoms and general obstetric precautions including but not limited to vaginal bleeding, contractions, leaking of fluid and fetal movement were reviewed in detail with the patient. Please refer to  After Visit Summary for other counseling recommendations.   Return in about 2 weeks (around 04/04/2021) [facility] Future Appointments  Date Time Provider [place]9/26/2022  8:30 AM [facility]9/26/2022  8:45 AM WMC-MFC US4 [facility]-MFCUS [facility]9/28/2022  8:35 AM StTruett MainlandDO CWH-WMHP None  04/18/2021  9:35 AM WiSeabron SpatesCNM CWH-WMHP None  05/03/2021  8:35 AM StNehemiah SettleJaTanna SavoyDO CWH-WMHP None    MaHansel FeinsteinCNM

## 2021-04-03 ENCOUNTER — Ambulatory Visit: Payer: BC Managed Care – PPO

## 2021-04-05 ENCOUNTER — Encounter: Payer: BC Managed Care – PPO | Admitting: Family Medicine

## 2021-04-13 ENCOUNTER — Ambulatory Visit (HOSPITAL_BASED_OUTPATIENT_CLINIC_OR_DEPARTMENT_OTHER): Payer: BC Managed Care – PPO

## 2021-04-13 ENCOUNTER — Encounter: Payer: Self-pay | Admitting: *Deleted

## 2021-04-13 ENCOUNTER — Ambulatory Visit: Payer: BC Managed Care – PPO | Attending: Obstetrics and Gynecology | Admitting: *Deleted

## 2021-04-13 ENCOUNTER — Other Ambulatory Visit: Payer: Self-pay

## 2021-04-13 VITALS — BP 101/59 | HR 69

## 2021-04-13 DIAGNOSIS — Z3A33 33 weeks gestation of pregnancy: Secondary | ICD-10-CM | POA: Diagnosis not present

## 2021-04-13 DIAGNOSIS — Z362 Encounter for other antenatal screening follow-up: Secondary | ICD-10-CM | POA: Insufficient documentation

## 2021-04-13 DIAGNOSIS — O09522 Supervision of elderly multigravida, second trimester: Secondary | ICD-10-CM | POA: Diagnosis not present

## 2021-04-13 DIAGNOSIS — O09899 Supervision of other high risk pregnancies, unspecified trimester: Secondary | ICD-10-CM

## 2021-04-13 DIAGNOSIS — O09523 Supervision of elderly multigravida, third trimester: Secondary | ICD-10-CM

## 2021-04-14 ENCOUNTER — Other Ambulatory Visit: Payer: Self-pay | Admitting: *Deleted

## 2021-04-14 DIAGNOSIS — O09523 Supervision of elderly multigravida, third trimester: Secondary | ICD-10-CM

## 2021-04-18 ENCOUNTER — Ambulatory Visit (INDEPENDENT_AMBULATORY_CARE_PROVIDER_SITE_OTHER): Payer: BC Managed Care – PPO | Admitting: Advanced Practice Midwife

## 2021-04-18 ENCOUNTER — Other Ambulatory Visit: Payer: Self-pay

## 2021-04-18 ENCOUNTER — Encounter: Payer: Self-pay | Admitting: Advanced Practice Midwife

## 2021-04-18 VITALS — BP 88/47 | HR 70 | Wt 165.0 lb

## 2021-04-18 DIAGNOSIS — Z8759 Personal history of other complications of pregnancy, childbirth and the puerperium: Secondary | ICD-10-CM

## 2021-04-18 DIAGNOSIS — Z8719 Personal history of other diseases of the digestive system: Secondary | ICD-10-CM

## 2021-04-18 DIAGNOSIS — O09899 Supervision of other high risk pregnancies, unspecified trimester: Secondary | ICD-10-CM

## 2021-04-18 DIAGNOSIS — O09529 Supervision of elderly multigravida, unspecified trimester: Secondary | ICD-10-CM

## 2021-04-18 NOTE — Progress Notes (Signed)
   PRENATAL VISIT NOTE  Subjective:  Olivia Salinas is a 45 y.o. G3P1011 at [redacted]w[redacted]d being seen today for ongoing prenatal care.  She is currently monitored for the following issues for this high-risk pregnancy and has Liver cyst; Supervision of other high risk pregnancy, antepartum, unspecified trimester; AMA (advanced maternal age) multigravida 35+; History of cholestasis during pregnancy; Hemangioma; Neoplasm of uncertain behavior of skin; Skin tag; and Viral warts on their problem list.  Patient reports  right hip pain.  Discussed PT.  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 .  Contractions: Irritability. Vag. Bleeding: None.  Movement: Present. Denies leaking of fluid.   The following portions of the patient's history were reviewed and updated as appropriate: allergies, current medications, past family history, past medical history, past social history, past surgical history and problem list.   Objective:   Vitals:   04/18/21 1003  BP: (!) 88/47  Pulse: 70  Weight: 165 lb (74.8 kg)    Fetal Status: Fetal Heart Rate (bpm): 144   Movement: Present     General:  Alert, oriented and cooperative. Patient is in no acute distress.  Skin: Skin is warm and dry. No rash noted.   Cardiovascular: Normal heart rate noted  Respiratory: Normal respiratory effort, no problems with respiration noted  Abdomen: Soft, gravid, appropriate for gestational age.  Pain/Pressure: Present     Pelvic: Cervical exam deferred        Extremities: Normal range of motion.  Edema: Trace  Mental Status: Normal mood and affect. Normal behavior. Normal judgment and thought content.   Assessment and Plan:  Pregnancy: G3P1011 at [redacted]w[redacted]d 1. Supervision of other high risk pregnancy, antepartum, unspecified trimester     Conservative care for hip, ice, stretching  2. Antepartum multigravida of advanced maternal age     Has BPPs scheduled  3. History of cholestasis during pregnancy     No  complaints  Preterm labor symptoms and general obstetric precautions including but not limited to vaginal bleeding, contractions, leaking of fluid and fetal movement were reviewed in detail with the patient. Please refer to After Visit Summary for other counseling recommendations.   Return in about 2 weeks (around 05/02/2021) for [facility].  Future Appointments  Date Time Provider [place]  05/03/2021  8:35 AM Truett Mainland, DO [facility] None  05/04/2021  9:45 AM WMC-MFC NURSE [facility]  05/04/2021 10:00 AM WMC-MFC US1 WMC[facility]MFCUS [facility]  05/12/2021  9:45 AM WMC-MFC NURSE [facility]-MFC [facility]  05/12/2021 10:00 AM WMC-MFC US1 [facility]MC[facility]  05/18/2021  9:45 AM WMC-MFC NURSE W[facility]-MFC [facility]  05/18/2021 10:00 AM WMC-MFC US1 W[facility]-MFCUS W[facility]    Hansel Feinstein, CNM

## 2021-05-03 ENCOUNTER — Other Ambulatory Visit: Payer: Self-pay

## 2021-05-03 ENCOUNTER — Ambulatory Visit (INDEPENDENT_AMBULATORY_CARE_PROVIDER_SITE_OTHER): Payer: BC Managed Care – PPO | Admitting: Family Medicine

## 2021-05-03 ENCOUNTER — Other Ambulatory Visit (HOSPITAL_COMMUNITY)
Admission: RE | Admit: 2021-05-03 | Discharge: 2021-05-03 | Disposition: A | Discharge status: Home / Self Care | Payer: BC Managed Care – PPO | Source: Ambulatory Visit | Attending: Family Medicine | Admitting: Family Medicine

## 2021-05-03 VITALS — BP 96/54 | HR 72 | Wt 166.0 lb

## 2021-05-03 DIAGNOSIS — Z3A36 36 weeks gestation of pregnancy: Secondary | ICD-10-CM | POA: Insufficient documentation

## 2021-05-03 DIAGNOSIS — O09523 Supervision of elderly multigravida, third trimester: Secondary | ICD-10-CM | POA: Diagnosis not present

## 2021-05-03 DIAGNOSIS — O09893 Supervision of other high risk pregnancies, third trimester: Secondary | ICD-10-CM | POA: Insufficient documentation

## 2021-05-03 DIAGNOSIS — Z8759 Personal history of other complications of pregnancy, childbirth and the puerperium: Secondary | ICD-10-CM | POA: Diagnosis not present

## 2021-05-03 DIAGNOSIS — Z8719 Personal history of other diseases of the digestive system: Secondary | ICD-10-CM | POA: Diagnosis not present

## 2021-05-03 DIAGNOSIS — O09899 Supervision of other high risk pregnancies, unspecified trimester: Secondary | ICD-10-CM

## 2021-05-03 NOTE — Progress Notes (Signed)
   PRENATAL VISIT NOTE  Subjective:  Olivia Salinas is a 45 y.o. G3P1011 at [redacted]w[redacted]d being seen today for ongoing prenatal care.  She is currently monitored for the following issues for this high-risk pregnancy and has Liver cyst; Supervision of other high risk pregnancy, antepartum, unspecified trimester; AMA (advanced maternal age) multigravida 35+; History of cholestasis during pregnancy; Hemangioma; Neoplasm of uncertain behavior of skin; Skin tag; and Viral warts on their problem list.  Patient reports no complaints.  Contractions: Irritability. Vag. Bleeding: None.  Movement: Present. Denies leaking of fluid.   The following portions of the patient's history were reviewed and updated as appropriate: allergies, current medications, past family history, past medical history, past social history, past surgical history and problem list.   Objective:   Vitals:   05/03/21 0858  BP: (!) 96/54  Pulse: 72  Weight: 166 lb (75.3 kg)    Fetal Status: Fetal Heart Rate (bpm): 140 Fundal Height: 36 cm Movement: Present     General:  Alert, oriented and cooperative. Patient is in no acute distress.  Skin: Skin is warm and dry. No rash noted.   Cardiovascular: Normal heart rate noted  Respiratory: Normal respiratory effort, no problems with respiration noted  Abdomen: Soft, gravid, appropriate for gestational age.  Pain/Pressure: Present     Pelvic: Cervical exam deferred        Extremities: Normal range of motion.  Edema: Trace  Mental Status: Normal mood and affect. Normal behavior. Normal judgment and thought content.   Assessment and Plan:  Pregnancy: G3P1011 at [redacted]w[redacted]d 1. [redacted] weeks gestation of pregnancy - Culture, beta strep (group b only) - GC/Chlamydia probe amp ()not at [facility]  2. Supervision of other high risk pregnancy, antepartum, unspecified trimester FHT and FH normal  3. Multigravida of advanced maternal age in third trimester Delivery between 39-40 weeks Antenatal  testing  4. History of cholestasis during pregnancy   Preterm labor symptoms and general obstetric precautions including but not limited to vaginal bleeding, contractions, leaking of fluid and fetal movement were reviewed in detail with the patient. Please refer to After Visit Summary for other counseling recommendations.   No follow-ups on file.  Future Appointments  Date Time Provider [place]  05/04/2021  9:45 AM WMC-MFC NURSE [facility]FC [facility]  05/04/2021 10:00 AM WMC-MFC US1 WMC-MFCUS [facility]  05/09/2021 11:15 AM Seabron Spates, CNM CWH-WMHP None  05/12/2021  9:45 AM WMC-MFC NURSE WMC-MFC [facility]  05/12/2021 10:00 AM WMC-MFC US1 WMC-MFCUS [facility]  05/17/2021  8:35 AM Truett Mainland, DO C[facility]-WM[facility] None  05/18/2021  9:45 AM WMC-MFC NURSE WMC-MFC [facility]  05/18/2021 10:00 AM WMC-MFC US1 WMC-MFCUS [facility]  05/25/2021 11:15 AM Nehemiah Settle, Tanna Savoy, DO C[facility] None    Truett Mainland, DO

## 2021-05-04 ENCOUNTER — Ambulatory Visit: Payer: BC Managed Care – PPO | Admitting: *Deleted

## 2021-05-04 ENCOUNTER — Ambulatory Visit: Payer: BC Managed Care – PPO | Attending: Obstetrics and Gynecology

## 2021-05-04 ENCOUNTER — Encounter: Payer: Self-pay | Admitting: *Deleted

## 2021-05-04 VITALS — BP 95/50 | HR 65

## 2021-05-04 DIAGNOSIS — O09523 Supervision of elderly multigravida, third trimester: Secondary | ICD-10-CM | POA: Diagnosis not present

## 2021-05-04 DIAGNOSIS — O09899 Supervision of other high risk pregnancies, unspecified trimester: Secondary | ICD-10-CM

## 2021-05-04 DIAGNOSIS — Z3A36 36 weeks gestation of pregnancy: Secondary | ICD-10-CM

## 2021-05-04 LAB — GC/CHLAMYDIA PROBE AMP (~~LOC~~) NOT AT ARMC
Chlamydia: NEGATIVE
Comment: NEGATIVE
Comment: NORMAL
Neisseria Gonorrhea: NEGATIVE

## 2021-05-07 LAB — CULTURE, BETA STREP (GROUP B ONLY): Strep Gp B Culture: NEGATIVE

## 2021-05-09 ENCOUNTER — Encounter: Payer: Self-pay | Admitting: Advanced Practice Midwife

## 2021-05-09 ENCOUNTER — Ambulatory Visit (INDEPENDENT_AMBULATORY_CARE_PROVIDER_SITE_OTHER): Payer: BC Managed Care – PPO | Admitting: Advanced Practice Midwife

## 2021-05-09 ENCOUNTER — Other Ambulatory Visit: Payer: Self-pay

## 2021-05-09 VITALS — BP 97/58 | HR 63 | Wt 167.0 lb

## 2021-05-09 DIAGNOSIS — O09899 Supervision of other high risk pregnancies, unspecified trimester: Secondary | ICD-10-CM

## 2021-05-09 DIAGNOSIS — Z3A37 37 weeks gestation of pregnancy: Secondary | ICD-10-CM

## 2021-05-09 DIAGNOSIS — O09523 Supervision of elderly multigravida, third trimester: Secondary | ICD-10-CM

## 2021-05-09 NOTE — Progress Notes (Signed)
   PRENATAL VISIT NOTE  Subjective:  Olivia Salinas is a 45 y.o. G3P1011 at [redacted]w[redacted]d being seen today for ongoing prenatal care.  She is currently monitored for the following issues for this high-risk pregnancy and has Liver cyst; Supervision of other high risk pregnancy, antepartum, unspecified trimester; AMA (advanced maternal age) multigravida 35+; History of cholestasis during pregnancy; Hemangioma; Neoplasm of uncertain behavior of skin; Skin tag; and Viral warts on their problem list.  Patient reports occasional contractions.  Contractions: Irritability. Vag. Bleeding: None.  Movement: Present. Denies leaking of fluid.   The following portions of the patient's history were reviewed and updated as appropriate: allergies, current medications, past family history, past medical history, past social history, past surgical history and problem list.   Objective:   Vitals:   05/09/21 1123  BP: (!) 97/58  Pulse: 63  Weight: 167 lb (75.8 kg)    Fetal Status: Fetal Heart Rate (bpm): 131   Movement: Present     General:  Alert, oriented and cooperative. Patient is in no acute distress.  Skin: Skin is warm and dry. No rash noted.   Cardiovascular: Normal heart rate noted  Respiratory: Normal respiratory effort, no problems with respiration noted  Abdomen: Soft, gravid, appropriate for gestational age.  Pain/Pressure: Absent     Pelvic: Cervical exam deferred        Extremities: Normal range of motion.  Edema: Trace  Mental Status: Normal mood and affect. Normal behavior. Normal judgment and thought content.   Assessment and Plan:  Pregnancy: G3P1011 at [redacted]w[redacted]d 1. Supervision of other high risk pregnancy, antepartum, unspecified trimester      GBS neg  2. Multigravida of advanced maternal age in third trimester     Weekly testing      IOL scheduled for November 18  3. [redacted] weeks gestation of pregnancy   Term labor symptoms and general obstetric precautions including but not limited to  vaginal bleeding, contractions, leaking of fluid and fetal movement were reviewed in detail with the patient. Please refer to After Visit Summary for other counseling recommendations.   Return in about 1 week (around 05/16/2021) for [facility].  Future Appointments  Date Time Provider [place]  05/12/2021  9:00 AM WMC-MFC NURSE [facility]  05/12/2021  9:15 AM WMC-MFC US2 WMC[facility]MFCUS [facility]  05/17/2021  8:35 AM Truett Mainland, DO CWH-WM[facility] None  05/18/2021  9:45 AM WMC-MFC NURSE WMC-MFC [facility]  05/18/2021 10:00 AM WMC-MFC US1 WMC-MFCUS [facility]  05/25/2021 11:15 AM Stinson, Tanna Savoy, DO CWH-WMHP None    Hansel Feinstein, CNM

## 2021-05-12 ENCOUNTER — Ambulatory Visit: Payer: BC Managed Care – PPO | Admitting: *Deleted

## 2021-05-12 ENCOUNTER — Ambulatory Visit: Payer: BC Managed Care – PPO | Attending: Obstetrics and Gynecology

## 2021-05-12 ENCOUNTER — Other Ambulatory Visit: Payer: Self-pay

## 2021-05-12 VITALS — BP 97/66 | HR 68

## 2021-05-12 DIAGNOSIS — O09523 Supervision of elderly multigravida, third trimester: Secondary | ICD-10-CM | POA: Insufficient documentation

## 2021-05-12 DIAGNOSIS — Z3A37 37 weeks gestation of pregnancy: Secondary | ICD-10-CM | POA: Diagnosis not present

## 2021-05-12 DIAGNOSIS — O09899 Supervision of other high risk pregnancies, unspecified trimester: Secondary | ICD-10-CM | POA: Diagnosis present

## 2021-05-12 DIAGNOSIS — Z362 Encounter for other antenatal screening follow-up: Secondary | ICD-10-CM

## 2021-05-17 ENCOUNTER — Other Ambulatory Visit: Payer: Self-pay | Admitting: Advanced Practice Midwife

## 2021-05-17 ENCOUNTER — Ambulatory Visit (INDEPENDENT_AMBULATORY_CARE_PROVIDER_SITE_OTHER): Payer: BC Managed Care – PPO | Admitting: Family Medicine

## 2021-05-17 ENCOUNTER — Other Ambulatory Visit: Payer: Self-pay

## 2021-05-17 VITALS — BP 99/59 | HR 87 | Wt 168.0 lb

## 2021-05-17 DIAGNOSIS — O09899 Supervision of other high risk pregnancies, unspecified trimester: Secondary | ICD-10-CM

## 2021-05-17 DIAGNOSIS — O09523 Supervision of elderly multigravida, third trimester: Secondary | ICD-10-CM

## 2021-05-17 DIAGNOSIS — Z3A38 38 weeks gestation of pregnancy: Secondary | ICD-10-CM

## 2021-05-17 NOTE — Progress Notes (Signed)
   PRENATAL VISIT NOTE  Subjective:  Olivia Salinas is a 45 y.o. G3P1011 at [redacted]w[redacted]d being seen today for ongoing prenatal care.  She is currently monitored for the following issues for this high-risk pregnancy and has Liver cyst; Supervision of other high risk pregnancy, antepartum, unspecified trimester; AMA (advanced maternal age) multigravida 35+; History of cholestasis during pregnancy; Hemangioma; Neoplasm of uncertain behavior of skin; Skin tag; and Viral warts on their problem list.  Patient reports occasional contractions.  Contractions: Irritability. Vag. Bleeding: None.  Movement: Present. Denies leaking of fluid.   The following portions of the patient's history were reviewed and updated as appropriate: allergies, current medications, past family history, past medical history, past social history, past surgical history and problem list.   Objective:   Vitals:   05/17/21 0847  BP: (!) 99/59  Pulse: 87  Weight: 168 lb (76.2 kg)    Fetal Status: Fetal Heart Rate (bpm): 135   Movement: Present     General:  Alert, oriented and cooperative. Patient is in no acute distress.  Skin: Skin is warm and dry. No rash noted.   Cardiovascular: Normal heart rate noted  Respiratory: Normal respiratory effort, no problems with respiration noted  Abdomen: Soft, gravid, appropriate for gestational age.  Pain/Pressure: Absent     Pelvic: Cervical exam deferred        Extremities: Normal range of motion.  Edema: Trace  Mental Status: Normal mood and affect. Normal behavior. Normal judgment and thought content.   Assessment and Plan:  Pregnancy: G3P1011 at [redacted]w[redacted]d 1. [redacted] weeks gestation of pregnancy  2. Supervision of other high risk pregnancy, antepartum, unspecified trimester FHT and FH normal  3. Multigravida of advanced maternal age in third trimester On ASA 81mg . BP normal Scans normal Induce next Thursday.  Term labor symptoms and general obstetric precautions including but not  limited to vaginal bleeding, contractions, leaking of fluid and fetal movement were reviewed in detail with the patient. Please refer to After Visit Summary for other counseling recommendations.   No follow-ups on file.  Future Appointments  Date Time Provider [place]  05/18/2021  9:45 AM [facility] NURSE [facility]  05/18/2021 10:00 AM [facility] US1 [facility]  06/29/2021  9:35 AM Truett Mainland, DO [facility] None    Truett Mainland, DO

## 2021-05-18 ENCOUNTER — Ambulatory Visit: Payer: BC Managed Care – PPO | Attending: Obstetrics and Gynecology

## 2021-05-18 ENCOUNTER — Ambulatory Visit: Payer: BC Managed Care – PPO | Admitting: *Deleted

## 2021-05-18 ENCOUNTER — Encounter: Payer: Self-pay | Admitting: *Deleted

## 2021-05-18 VITALS — BP 103/68 | HR 75

## 2021-05-18 DIAGNOSIS — Z3A38 38 weeks gestation of pregnancy: Secondary | ICD-10-CM | POA: Diagnosis not present

## 2021-05-18 DIAGNOSIS — O09523 Supervision of elderly multigravida, third trimester: Secondary | ICD-10-CM

## 2021-05-18 DIAGNOSIS — O09899 Supervision of other high risk pregnancies, unspecified trimester: Secondary | ICD-10-CM

## 2021-05-22 ENCOUNTER — Telehealth: Payer: Self-pay

## 2021-05-22 NOTE — Telephone Encounter (Signed)
-----   Message from Maurine Minister, [place] sent at 05/22/2021  2:24 PM EST ----- Regarding: Request Call Back [place] discharge.  Induction scheduled for 11/17

## 2021-05-22 NOTE — Telephone Encounter (Signed)
Patient called stating that she had brown discharge this morning when she woke up.patient is 39 weeks.  Patient states she is having some more irregular but painful contractions. Patient made aware this sounds like old blood mixed with her discharge. Patient assured that this may be her body preparing for labor. If she has bright red bleeding, any leaking of fluid or decreased fetal movement this is all reasons to go to Maternity admissions unit at [place] [facility]

## 2021-05-23 ENCOUNTER — Other Ambulatory Visit: Payer: Self-pay

## 2021-05-23 ENCOUNTER — Inpatient Hospital Stay (HOSPITAL_COMMUNITY)
Admission: AD | Admit: 2021-05-23 | Discharge: 2021-05-26 | DRG: 807 | Disposition: A | Discharge status: Home / Self Care | Payer: BC Managed Care – PPO | Attending: Obstetrics and Gynecology | Admitting: Obstetrics and Gynecology

## 2021-05-23 ENCOUNTER — Encounter (HOSPITAL_COMMUNITY): Payer: Self-pay | Admitting: Obstetrics and Gynecology

## 2021-05-23 DIAGNOSIS — Z8719 Personal history of other diseases of the digestive system: Secondary | ICD-10-CM

## 2021-05-23 DIAGNOSIS — Z20822 Contact with and (suspected) exposure to covid-19: Secondary | ICD-10-CM | POA: Diagnosis present

## 2021-05-23 DIAGNOSIS — Z3A39 39 weeks gestation of pregnancy: Secondary | ICD-10-CM

## 2021-05-23 DIAGNOSIS — O09899 Supervision of other high risk pregnancies, unspecified trimester: Secondary | ICD-10-CM

## 2021-05-23 DIAGNOSIS — O09523 Supervision of elderly multigravida, third trimester: Secondary | ICD-10-CM

## 2021-05-23 DIAGNOSIS — Z23 Encounter for immunization: Secondary | ICD-10-CM

## 2021-05-23 DIAGNOSIS — O09529 Supervision of elderly multigravida, unspecified trimester: Secondary | ICD-10-CM

## 2021-05-23 NOTE — MAU Note (Signed)
..  Olivia Salinas is a 45 y.o. at [redacted]w[redacted]d here in MAU reporting: CTX every 3-4 mins, with a pain rating of 7/10. Pt denies LOF, just brownish red discharge since Monday. Pt endorses +FM. Pt denies VB and PIH s/s.  GBS-   Onset of complaint: 1700 Pain score: 7/10  Vitals:   05/23/21 2259  BP: 114/68  Pulse: 67  Resp: 18  Temp: 97.8 F (36.6 C)  SpO2: 99%     FHT:135  Lab orders placed from triage:  none

## 2021-05-24 ENCOUNTER — Encounter (HOSPITAL_COMMUNITY): Payer: Self-pay | Admitting: Obstetrics and Gynecology

## 2021-05-24 DIAGNOSIS — O26893 Other specified pregnancy related conditions, third trimester: Secondary | ICD-10-CM | POA: Diagnosis present

## 2021-05-24 DIAGNOSIS — Z23 Encounter for immunization: Secondary | ICD-10-CM | POA: Diagnosis not present

## 2021-05-24 DIAGNOSIS — Z3A39 39 weeks gestation of pregnancy: Secondary | ICD-10-CM

## 2021-05-24 DIAGNOSIS — Z20822 Contact with and (suspected) exposure to covid-19: Secondary | ICD-10-CM | POA: Diagnosis present

## 2021-05-24 LAB — CBC
HCT: 44.9 % (ref 36.0–46.0)
Hemoglobin: 14.5 g/dL (ref 12.0–15.0)
MCH: 29.5 pg (ref 26.0–34.0)
MCHC: 32.3 g/dL (ref 30.0–36.0)
MCV: 91.4 fL (ref 80.0–100.0)
Platelets: 220 10*3/uL (ref 150–400)
RBC: 4.91 MIL/uL (ref 3.87–5.11)
RDW: 13.1 % (ref 11.5–15.5)
WBC: 13 10*3/uL — ABNORMAL HIGH (ref 4.0–10.5)
nRBC: 0 % (ref 0.0–0.2)

## 2021-05-24 LAB — TYPE AND SCREEN
ABO/RH(D): O POS
Antibody Screen: NEGATIVE

## 2021-05-24 LAB — RESP PANEL BY RT-PCR (FLU A&B, COVID) ARPGX2
Influenza A by PCR: NEGATIVE
Influenza B by PCR: NEGATIVE
SARS Coronavirus 2 by RT PCR: NEGATIVE

## 2021-05-24 LAB — POCT FERN TEST: POCT Fern Test: NEGATIVE

## 2021-05-24 LAB — RPR: RPR Ser Ql: NONREACTIVE

## 2021-05-24 MED ORDER — OXYTOCIN-SODIUM CHLORIDE 30-0.9 UT/500ML-% IV SOLN
2.5000 [IU]/h | INTRAVENOUS | Status: DC
  Filled 2021-05-24: qty 500

## 2021-05-24 MED ORDER — LACTATED RINGERS IV SOLN: 500.0000 mL | INTRAVENOUS | Status: DC | PRN

## 2021-05-24 MED ORDER — DIBUCAINE (PERIANAL) 1 % EX OINT: 1.0000 "application " | TOPICAL_OINTMENT | CUTANEOUS | Status: DC | PRN

## 2021-05-24 MED ORDER — TERBUTALINE SULFATE 1 MG/ML IJ SOLN: 0.2500 mg | Freq: Once | INTRAMUSCULAR | Status: DC | PRN

## 2021-05-24 MED ORDER — WITCH HAZEL-GLYCERIN EX PADS: 1.0000 "application " | MEDICATED_PAD | CUTANEOUS | Status: DC | PRN

## 2021-05-24 MED ORDER — SIMETHICONE 80 MG PO CHEW: 80.0000 mg | CHEWABLE_TABLET | ORAL | Status: DC | PRN

## 2021-05-24 MED ORDER — LIDOCAINE HCL (PF) 1 % IJ SOLN
30.0000 mL | INTRAMUSCULAR | Status: AC | PRN
  Administered 2021-05-24: 30 mL via SUBCUTANEOUS
  Filled 2021-05-24: qty 30

## 2021-05-24 MED ORDER — MEDROXYPROGESTERONE ACETATE 150 MG/ML IM SUSP: 150.0000 mg | INTRAMUSCULAR | Status: DC | PRN

## 2021-05-24 MED ORDER — FENTANYL CITRATE (PF) 100 MCG/2ML IJ SOLN
100.0000 ug | INTRAMUSCULAR | Status: DC | PRN
  Administered 2021-05-24 (×2): 100 ug via INTRAVENOUS
  Filled 2021-05-24 (×2): qty 2

## 2021-05-24 MED ORDER — OXYCODONE-ACETAMINOPHEN 5-325 MG PO TABS: 2.0000 | ORAL_TABLET | ORAL | Status: DC | PRN

## 2021-05-24 MED ORDER — ONDANSETRON HCL 4 MG/2ML IJ SOLN: 4.0000 mg | Freq: Four times a day (QID) | INTRAMUSCULAR | Status: DC | PRN

## 2021-05-24 MED ORDER — OXYCODONE-ACETAMINOPHEN 5-325 MG PO TABS: 1.0000 | ORAL_TABLET | ORAL | Status: DC | PRN

## 2021-05-24 MED ORDER — BENZOCAINE-MENTHOL 20-0.5 % EX AERO
1.0000 "application " | INHALATION_SPRAY | CUTANEOUS | Status: DC | PRN
  Administered 2021-05-24: 1 via TOPICAL
  Filled 2021-05-24: qty 56

## 2021-05-24 MED ORDER — ACETAMINOPHEN 325 MG PO TABS: 650.0000 mg | ORAL_TABLET | ORAL | Status: DC | PRN

## 2021-05-24 MED ORDER — MISOPROSTOL 200 MCG PO TABS
800.0000 ug | ORAL_TABLET | Freq: Once | ORAL | Status: AC
  Administered 2021-05-24: 800 ug via RECTAL

## 2021-05-24 MED ORDER — SENNOSIDES-DOCUSATE SODIUM 8.6-50 MG PO TABS
2.0000 | ORAL_TABLET | Freq: Every day | ORAL | Status: DC
  Administered 2021-05-25 – 2021-05-26 (×2): 2 via ORAL
  Filled 2021-05-24 (×2): qty 2

## 2021-05-24 MED ORDER — TRANEXAMIC ACID-NACL 1000-0.7 MG/100ML-% IV SOLN: 1000.0000 mg | INTRAVENOUS | Status: DC

## 2021-05-24 MED ORDER — IBUPROFEN 600 MG PO TABS
600.0000 mg | ORAL_TABLET | Freq: Four times a day (QID) | ORAL | Status: DC
  Administered 2021-05-24 – 2021-05-26 (×5): 600 mg via ORAL
  Filled 2021-05-24 (×7): qty 1

## 2021-05-24 MED ORDER — COCONUT OIL OIL
1.0000 "application " | TOPICAL_OIL | Status: DC | PRN
  Administered 2021-05-26: 1 via TOPICAL

## 2021-05-24 MED ORDER — MISOPROSTOL 25 MCG QUARTER TABLET: 25.0000 ug | ORAL_TABLET | ORAL | Status: DC | PRN

## 2021-05-24 MED ORDER — ONDANSETRON HCL 4 MG PO TABS: 4.0000 mg | ORAL_TABLET | ORAL | Status: DC | PRN

## 2021-05-24 MED ORDER — PRENATAL MULTIVITAMIN CH
1.0000 | ORAL_TABLET | Freq: Every day | ORAL | Status: DC
  Administered 2021-05-25 – 2021-05-26 (×2): 1 via ORAL
  Filled 2021-05-24 (×2): qty 1

## 2021-05-24 MED ORDER — ONDANSETRON HCL 4 MG/2ML IJ SOLN: 4.0000 mg | INTRAMUSCULAR | Status: DC | PRN

## 2021-05-24 MED ORDER — TRANEXAMIC ACID-NACL 1000-0.7 MG/100ML-% IV SOLN
INTRAVENOUS | Status: AC
  Administered 2021-05-24: 1000 mg
  Filled 2021-05-24: qty 100

## 2021-05-24 MED ORDER — INFLUENZA VAC SPLIT QUAD 0.5 ML IM SUSY
0.5000 mL | PREFILLED_SYRINGE | INTRAMUSCULAR | Status: AC
  Administered 2021-05-25: 18:00:00 0.5 mL via INTRAMUSCULAR
  Filled 2021-05-24: qty 0.5

## 2021-05-24 MED ORDER — MISOPROSTOL 200 MCG PO TABS
ORAL_TABLET | ORAL | Status: AC
  Filled 2021-05-24: qty 4

## 2021-05-24 MED ORDER — LACTATED RINGERS IV SOLN: INTRAVENOUS | Status: DC

## 2021-05-24 MED ORDER — MEASLES, MUMPS & RUBELLA VAC IJ SOLR: 0.5000 mL | Freq: Once | INTRAMUSCULAR | Status: DC

## 2021-05-24 MED ORDER — OXYTOCIN BOLUS FROM INFUSION: 333.0000 mL | Freq: Once | INTRAVENOUS | Status: DC

## 2021-05-24 MED ORDER — TETANUS-DIPHTH-ACELL PERTUSSIS 5-2.5-18.5 LF-MCG/0.5 IM SUSY: 0.5000 mL | PREFILLED_SYRINGE | Freq: Once | INTRAMUSCULAR | Status: DC

## 2021-05-24 MED ORDER — DIPHENHYDRAMINE HCL 25 MG PO CAPS: 25.0000 mg | ORAL_CAPSULE | Freq: Four times a day (QID) | ORAL | Status: DC | PRN

## 2021-05-24 MED ORDER — SOD CITRATE-CITRIC ACID 500-334 MG/5ML PO SOLN: 30.0000 mL | ORAL | Status: DC | PRN

## 2021-05-24 NOTE — H&P (Signed)
OBSTETRIC ADMISSION HISTORY AND PHYSICAL  Olivia Salinas is a 45 y.o. female G3P1011 with IUP at [redacted]w[redacted]d by LMP presenting for spontaneous onset of labor. She reports +FMs, No LOF, no VB, no blurry vision, headaches or peripheral edema, and RUQ pain.  She plans on breast feeding. She is unsure what she desires for birth control. She received her prenatal care at [place]  Dating: By LMP --->  Estimated Date of Delivery: 05/29/21  Sono:    @[redacted]w[redacted]d , CWD, normal anatomy, cephalic presentation, 0623J, 36% EFW  Nursing Staff Provider  Office Location [facility]  Dating  LMP  Language  English  Anatomy US  Normal - incomplete - F/U US 4 weeks  Flu Vaccine   Genetic/Carrier Screen  NIPS:   declined   TDaP Vaccine  03/06/2021 Hgb A1C or  GTT Early  Third trimester   COVID Vaccine  12/2019 & 01/2020   LAB RESULTS   Rhogam   N/A Blood Type O/Positive/-- (05/24 0947)   Baby Feeding Plan  Breast  Antibody Negative (05/24 0947)  Contraception  Unsure Rubella 22.30 (05/24 0947)  Circumcision  Yes, if boy  RPR Non Reactive (05/24 0947)   Pediatrician   [place] HBsAg Negative (05/24 0947)   Support Person  Sonny(husband) HCVAb Negative  Prenatal Classes  N/A HIV Non Reactive (05/24 0947)     BTL Consent   GBS   neg  VBAC Consent  N/A Pap        BP Cuff  Waterbirth  [ ]  Class [ ]  Consent [ ]  CNM visit  PHQ9 & GAD7 [  ] new OB [  ] 28 weeks  [  ] 36 weeks Induction  [ ]  Orders Entered [ ] Foley Y/N    Prenatal History/Complications: AMA  Past Medical History: History reviewed. No pertinent past medical history.  Past Surgical History: History reviewed. No pertinent surgical history.  Obstetrical History: OB History     Gravida  3   Para  1   Term  1   Preterm      AB  1   Living  1      SAB  1   IAB      Ectopic      Multiple  0   Live Births  1           Social History Social History   Socioeconomic History   Marital status: Married    Spouse name: Not on  file   Number of children: Not on file   Years of education: Not on file   Highest education level: Not on file  Occupational History   Not on file  Tobacco Use   Smoking status: Never   Smokeless tobacco: Never  Vaping Use   Vaping Use: Never used  Substance and Sexual Activity   Alcohol use: No   Drug use: No   Sexual activity: Not Currently    Birth control/protection: None  Other Topics Concern   Not on file  Social History Narrative   Not on file   Social Determinants of Health   Financial Resource Strain: Not on file  Food Insecurity: Not on file  Transportation Needs: Not on file  Physical Activity: Not on file  Stress: Not on file  Social Connections: Not on file    Family History: Family History  Problem Relation Age of Onset   Hypertension Mother    Arthritis Mother    Arthritis Father  Allergies: No Known Allergies  Medications Prior to Admission  Medication Sig Dispense Refill Last Dose   Prenatal Vit-Fe Fumarate-FA (PRENATAL VITAMIN PO) Take 1 tablet by mouth daily.   05/23/2021   acetaminophen (TYLENOL) 325 MG tablet Take 2 tablets (650 mg total) by mouth every 4 (four) hours as needed (for pain scale < 4). (Patient not taking: Reported on 05/18/2021) 30 tablet 0    ascorbic acid (VITAMIN C) 500 MG tablet Take by mouth.      aspirin 81 MG chewable tablet Chew by mouth daily. (Patient not taking: No sig reported)      folic acid (FOLVITE) 301 MCG tablet Take 800 mcg by mouth daily.  (Patient not taking: No sig reported)      omeprazole (PRILOSEC) 40 MG capsule Take by mouth. (Patient not taking: No sig reported)      omeprazole (PRILOSEC) 40 MG capsule Take 1 capsule by mouth daily. (Patient not taking: No sig reported)        Review of Systems   All systems reviewed and negative except as stated in HPI  Blood pressure 114/68, pulse 67, temperature 97.8 F (36.6 C), temperature source Oral, resp. rate 18, height 5\' 6"  (1.676 m), weight 76.2  kg, last menstrual period 08/22/2020, SpO2 99 %, unknown if currently breastfeeding. General appearance: alert, cooperative, and no distress Lungs: clear to auscultation bilaterally Heart: regular rate and rhythm Abdomen: soft, non-tender; bowel sounds normal Pelvic: n/a Extremities: Homans sign is negative, no sign of DVT DTR's +2 Presentation: cephalic Fetal monitoringBaseline: 120 bpm, Variability: Good {> 6 bpm), Accelerations: Reactive, and Decelerations: Absent Uterine activityFrequency: Every 6-8 minutes Dilation: 3.5 Effacement (%): 60 Station: -2 Exam by:: Youlanda Roys, RN   Prenatal labs: ABO, Rh: O/Positive/-- (05/24 0947) Antibody: Negative (05/24 0947) Rubella: 22.30 (05/24 0947) RPR: Non Reactive (08/29 0913)  HBsAg: Negative (05/24 0947)  HIV: Non Reactive (08/29 0913)  GBS: Negative/-- (10/26 6010)   Prenatal Transfer Tool  Maternal Diabetes: No Genetic Screening: Declined Maternal Ultrasounds/Referrals: Normal Fetal Ultrasounds or other Referrals:  Referred to Materal Fetal Medicine  Maternal Substance Abuse:  No Significant Maternal Medications:  None Significant Maternal Lab Results: Group B Strep negative  No results found for this or any previous visit (from the past 24 hour(s)).  Patient Active Problem List   Diagnosis Date Noted   Hemangioma 03/21/2021   Neoplasm of uncertain behavior of skin 03/21/2021   Skin tag 03/21/2021   Viral warts 03/21/2021   History of cholestasis during pregnancy 03/06/2021   Supervision of other high risk pregnancy, antepartum, unspecified trimester 11/29/2020   AMA (advanced maternal age) multigravida 35+ 11/29/2020   Liver cyst 04/01/2018    Assessment/Plan:  Olivia Salinas is a 45 y.o. G3P1011 at [redacted]w[redacted]d here for spontaneous onset of labor, hx rapid labor  Due to high census at [facility], patient holding in MAU and counseled about holding.  #Labor: expectant management at this time #Pain: Planning epidural,  options for pain management in MAU reviewed #FWB: Cat 1 #ID:  GBS neg #MOF: breast #MOC: unsure #Circ:  [place], CNM  05/24/2021, 1:26 AM

## 2021-05-24 NOTE — Progress Notes (Signed)
Labor Progress Note Annaelle Kasel is a 45 y.o. G3P1011 at [redacted]w[redacted]d presented for SOL  S:  Patient resting with IV pain medication  O:  BP (!) 99/57   Pulse (!) 55   Temp 98.1 F (36.7 C) (Oral)   Resp 18   Ht 5\' 6"  (1.676 m)   Wt 76.2 kg   LMP 08/22/2020   SpO2 100%   BMI 27.12 kg/m   Fetal Tracing:  Baseline: 120 Variability: moderate Accels: 15x15 Decels: none  Toco: 1-8   CVE: Dilation: 6 Effacement (%): 80 Cervical Position: Posterior Station: -2 Presentation: Vertex Exam by:: Len Blalock, CNM   A&P: 45 y.o. G3P1011 [redacted]w[redacted]d spontaneous onset of labor #Labor: Progressing well. Offered AROM and patient declined at this time. #Pain: IV pain medication  #FWB: Cat 1 #GBS negative  Wende Mott, CNM 5:02 AM

## 2021-05-24 NOTE — Progress Notes (Signed)
Olivia Salinas is a 45 y.o. G3P1011 at [redacted]w[redacted]d by LMP admitted for active labor  Subjective: Pt coping well with contractions. She received 1 dose of IV pain medication at 0322.    Objective: BP (!) 87/49   Pulse 61   Temp 98 F (36.7 C) (Oral)   Resp 16   Ht 5\' 6"  (1.676 m)   Wt 76.2 kg   LMP 08/22/2020   SpO2 100%   BMI 27.12 kg/m  No intake/output data recorded. No intake/output data recorded.  FHT:  FHR: 135 bpm, variability: moderate,  accelerations:  Present,  decelerations:  Absent UC:   regular, every 4 minutes SVE:   Dilation: 7.5 Effacement (%): 100 Station: -1 Exam by:: Ola Spurr, RN  Labs: Lab Results  Component Value Date   WBC 13.0 (H) 05/24/2021   HGB 14.5 05/24/2021   HCT 44.9 05/24/2021   MCV 91.4 05/24/2021   PLT 220 05/24/2021    Assessment / Plan: Spontaneous labor, progressing normally.  Pt desires expectant management at this time.  Will anticipate SROM or urge to push/delivery.  Labor: Progressing normally Preeclampsia:   n/a Fetal Wellbeing:  Category I Pain Control:  Labor support without medications I/D:   GBS neg Anticipated MOD:  NSVD  Fatima Blank 05/24/2021, 9:27 AM

## 2021-05-24 NOTE — Discharge Summary (Signed)
Postpartum Discharge Summary  Date of Service updated     Patient Name: Olivia Salinas DOB: [birth year] MRN: [id number]  Date of admission: 05/23/2021 Delivery date:05/24/2021  Delivering provider: Orvis Brill  Date of discharge: 05/26/2021  Admitting diagnosis: AMA (advanced maternal age) multigravida 49+ [O09.529] Intrauterine pregnancy: [redacted]w[redacted]d    Secondary diagnosis:  Active Problems:   AMA (advanced maternal age) multigravida 35+   History of cholestasis during pregnancy  Additional problems: None    Discharge diagnosis: Term Pregnancy Delivered                                              Post partum procedures: none Augmentation: N/A Complications: None  Hospital course: Onset of Labor With Vaginal Delivery      45y.o. yo G3P1011 at 3102w2das admitted in Active Labor on 05/23/2021. Patient had an uncomplicated labor course as follows:  Membrane Rupture Time/Date: 1:46 PM ,05/24/2021   Delivery Method:Vaginal, Spontaneous  Episiotomy: None  Lacerations:  2nd degree;Perineal  Patient had an uncomplicated postpartum course.  She is ambulating, tolerating a regular diet, passing flatus, and urinating well. Patient is discharged home in stable condition on 05/26/21.  Newborn Data: Birth date:05/24/2021  Birth time:1:48 PM  Gender:Female  Living status:Living  Apgars:9 ,9  Weight:3330 g   Magnesium Sulfate received: No BMZ received: No Rhophylac:No MMR:No T-DaP:Given prenatally Flu: No Transfusion:No  Physical exam  Vitals:   05/25/21 0530 05/25/21 1552 05/25/21 2126 05/26/21 0512  BP: (!) 87/56 (!) 96/59 (!) 96/53 96/66  Pulse: 60 61 65 62  Resp: 17 16 18 18   Temp: 97.7 F (36.5 C) 98.3 F (36.8 C) 98.4 F (36.9 C) 98.4 F (36.9 C)  TempSrc: Oral Oral Oral Oral  SpO2: 98%  97% 97%  Weight:      Height:       General: alert, cooperative, and no distress Lochia: appropriate Uterine Fundus: firm Incision: N/A DVT Evaluation: No evidence of  DVT seen on physical exam. No significant calf/ankle edema. Labs: Lab Results  Component Value Date   WBC 17.2 (H) 05/25/2021   HGB 11.9 (L) 05/25/2021   HCT 36.3 05/25/2021   MCV 89.6 05/25/2021   PLT 201 05/25/2021   CMP Latest Ref Rng & Units 02/22/2017  Glucose 65 - 99 mg/dL 102(H)  BUN 7 - 25 mg/dL 8  Creatinine 0.50 - 1.10 mg/dL 0.55  Sodium 135 - 146 mmol/L 137  Potassium 3.5 - 5.3 mmol/L 3.4(L)  Chloride 98 - 110 mmol/L 103  CO2 20 - 32 mmol/L 22  Calcium 8.6 - 10.2 mg/dL 8.7  Total Protein 6.1 - 8.1 g/dL 5.9(L)  Total Bilirubin 0.2 - 1.2 mg/dL 0.5  Alkaline Phos 33 - 115 U/L 130(H)  AST 10 - 30 U/L 14  ALT 6 - 29 U/L 9   Edinburgh Score: Edinburgh Postnatal Depression Scale Screening Tool 05/25/2021  I have been able to laugh and see the funny side of things. 0  I have looked forward with enjoyment to things. 0  I have blamed myself unnecessarily when things went wrong. 0  I have been anxious or worried for no good reason. 0  I have felt scared or panicky for no good reason. 0  Things have been getting on top of me. 1  I have been so unhappy that I have had difficulty sleeping.  0  I have felt sad or miserable. 0  I have been so unhappy that I have been crying. 0  The thought of harming myself has occurred to me. 0  Edinburgh Postnatal Depression Scale Total 1     After visit meds:  Allergies as of 05/26/2021   No Known Allergies      Medication List     STOP taking these medications    ascorbic acid 500 MG tablet Commonly known as: VITAMIN C   aspirin 81 MG chewable tablet   folic acid 030 MCG tablet Commonly known as: FOLVITE   omeprazole 40 MG capsule Commonly known as: PRILOSEC       TAKE these medications    acetaminophen 325 MG tablet Commonly known as: Tylenol Take 2 tablets (650 mg total) by mouth every 4 (four) hours as needed (for pain scale < 4).   ibuprofen 600 MG tablet Commonly known as: ADVIL Take 1 tablet (600 mg total)  by mouth every 6 (six) hours as needed.   PRENATAL VITAMIN PO Take 1 tablet by mouth daily.         Discharge home in stable condition Infant Feeding: Breast Infant Disposition: hopefully home with mother Discharge instruction: per After Visit Summary and Postpartum booklet. Activity: Advance as tolerated. Pelvic rest for 6 weeks.  Diet: routine diet Future Appointments: Future Appointments  Date Time Provider [place]  06/29/2021  9:35 AM Truett Mainland, DO [facility] None   Follow up Visit:  Message sent to [facility]: Please schedule this patient for a In person postpartum visit in 6 weeks with the following provider: Any provider. Additional Postpartum F/U: none   High risk pregnancy complicated by:  AMA Delivery mode:  Vaginal, Spontaneous  Anticipated Birth Control:  Unsure, discussed natural family planning    05/26/2021 Patriciaann Clan, DO

## 2021-05-24 NOTE — Lactation Note (Addendum)
This note was copied from a baby's chart. Lactation Consultation Note  Patient Name: Olivia Salinas [id number]'[id number] Date: 05/24/2021 Reason for consult: Initial assessment;Mother's request;Difficult latch;Term Age:43 hours  LC assisted latching infant in football with more depth and audible swallows heard.   Mom to use EBM and coconut oil for nipple care. Mom latching in cradle soreness with compression stripe. No other abrasions noted. RN, Eartha Inch to provide coconut oil for care.   Plan 1. To feed based on cues 8-12x 24hr period. Mom to offer breasts and look for signs of milk transfer.  2. If unable to latch, Mom to offer EBM via spoon 5-7 ml 3. I and O sheet reviewed.  All questions answered at the end of the visit.  Mom would like to start pumping in the morning with DEBP.   Maternal Data Has patient been taught Hand Expression?: Yes Does the patient have breastfeeding experience prior to this delivery?: Yes How long did the patient breastfeed?: pumped and bottle fed first child for 3 months.  Feeding Mother's Current Feeding Choice: Breast Milk  LATCH Score Latch: Repeated attempts needed to sustain latch, nipple held in mouth throughout feeding, stimulation needed to elicit sucking reflex.  Audible Swallowing: Spontaneous and intermittent  Type of Nipple: Everted at rest and after stimulation  Comfort (Breast/Nipple): Filling, red/small blisters or bruises, mild/mod discomfort  Hold (Positioning): Assistance needed to correctly position infant at breast and maintain latch.  LATCH Score: 7   Lactation Tools Discussed/Used Tools: Coconut oil  Interventions Interventions: Breast feeding basics reviewed;Adjust position;Assisted with latch;Support pillows;Education;Breast massage;Expressed milk;Hand express;LC Magazine features editor;Infant Driven Feeding Algorithm education;Breast compression;Coconut oil  Discharge    Consult Status Consult Status: Follow-up Date:  05/25/21 Follow-up type: In-patient    Edris Friedt  N[place] 05/24/2021, 9:46 PM

## 2021-05-25 ENCOUNTER — Inpatient Hospital Stay (HOSPITAL_COMMUNITY): Payer: BC Managed Care – PPO

## 2021-05-25 ENCOUNTER — Encounter: Payer: BC Managed Care – PPO | Admitting: Family Medicine

## 2021-05-25 ENCOUNTER — Inpatient Hospital Stay (HOSPITAL_COMMUNITY): Admission: AD | Admit: 2021-05-25 | Payer: BC Managed Care – PPO | Source: Home / Self Care | Admitting: Family Medicine

## 2021-05-25 LAB — CBC
HCT: 36.3 % (ref 36.0–46.0)
Hemoglobin: 11.9 g/dL — ABNORMAL LOW (ref 12.0–15.0)
MCH: 29.4 pg (ref 26.0–34.0)
MCHC: 32.8 g/dL (ref 30.0–36.0)
MCV: 89.6 fL (ref 80.0–100.0)
Platelets: 201 10*3/uL (ref 150–400)
RBC: 4.05 MIL/uL (ref 3.87–5.11)
RDW: 13.2 % (ref 11.5–15.5)
WBC: 17.2 10*3/uL — ABNORMAL HIGH (ref 4.0–10.5)
nRBC: 0 % (ref 0.0–0.2)

## 2021-05-25 NOTE — Progress Notes (Signed)
Post Partum Day 1 Subjective: She is feeling well and endorses intermittent cramping pain. She is ambulating and tolerating oral intake without difficulty. She would like to stay one more day and work with the Science writer.  Objective: Blood pressure (!) 87/56, pulse 60, temperature 97.7 F (36.5 C), temperature source Oral, resp. rate 17, height 5\' 6"  (1.676 m), weight 76.2 kg, last menstrual period 08/22/2020, SpO2 98 %, unknown if currently breastfeeding.  Physical Exam:  General: alert, cooperative, and no distress Lochia: appropriate Uterine Fundus: firm Incision: none DVT Evaluation: No evidence of DVT seen on physical exam.  Recent Labs    05/24/21 0222 05/25/21 0545  HGB 14.5 11.9*  HCT 44.9 36.3    Assessment/Plan: Olivia Salinas is a 45 year old G3P1011 who is doing well and meeting all goals. She has had hypotensive pressures and a Hgb drop from 14.5 to 11.9 but is asymptomatic. -Anticipate discharge tomorrow   LOS: 1 day   Archer Asa 05/25/2021, 7:12 AM

## 2021-05-25 NOTE — Lactation Note (Signed)
This note was copied from a baby's chart. Lactation Consultation Note  Patient Name: Olivia Salinas Date: 05/25/2021 Reason for consult: Follow-up assessment;Mother's request;Difficult latch;Term;Nipple pain/trauma;Breastfeeding assistance Age:43 hours  LC worked with Mom with different positions to get more depth on breast, infant latching on nipple. Mom comfortable in football position, infant recent feeding not very hungry at time of [place] visit.   Mom given comfort gels for nipple care. Mom aware to not use with coconut oil. Mom also given coconut oil to use before pumping.   Mom latching shallow, nipples sore at the time of the visit.   Plan 1. To feed based on cues 8-12x 24hr period. Mom to offer breasts and look for signs of milk transfer.  2. Mom to supplement with pace bottle feeding and slow flow nipple 7-12 ml with EBM 3. Mom to pump with DEBP q 3hrs for 66min.  Mom stated with changes made above pain reduces 3 to less than 2. If she needs a breast rest, pump set up she can offer EBM via pace bottle.  All questions answered at the end of the visit.   Maternal Data    Feeding Mother's Current Feeding Choice: Breast Milk  LATCH Score Latch: Repeated attempts needed to sustain latch, nipple held in mouth throughout feeding, stimulation needed to elicit sucking reflex.  Audible Swallowing: A few with stimulation  Type of Nipple: Everted at rest and after stimulation  Comfort (Breast/Nipple): Filling, red/small blisters or bruises, mild/mod discomfort  Hold (Positioning): Assistance needed to correctly position infant at breast and maintain latch.  LATCH Score: 6   Lactation Tools Discussed/Used Tools: Pump;Flanges;Coconut oil;Comfort gels Flange Size: 27 Breast pump type: Double-Electric Breast Pump Pump Education: Setup, frequency, and cleaning;Milk Storage Reason for Pumping: increase stimulation Pumping frequency: every 3 hrs for  67min  Interventions Interventions: Breast feeding basics reviewed;Adjust position;DEBP;Assisted with latch;Support pillows;Skin to skin;Position options;Education;Breast massage;Expressed milk;Pace feeding;Hand express;Coconut oil;Infant Driven Feeding Algorithm education;Comfort gels;Breast compression  Discharge Pump: Personal  Consult Status Consult Status: Follow-up Date: 05/26/21 Follow-up type: In-patient    Olivia Salinas  Olivia Salinas 05/25/2021, 10:10 PM

## 2021-05-26 ENCOUNTER — Encounter (HOSPITAL_COMMUNITY): Payer: Self-pay | Admitting: *Deleted

## 2021-05-26 MED ORDER — IBUPROFEN 600 MG PO TABS: 600.0000 mg | ORAL_TABLET | Freq: Four times a day (QID) | ORAL | 0 refills | Status: AC | PRN

## 2021-05-26 NOTE — Lactation Note (Signed)
This note was copied from a baby's chart. Lactation Consultation Note  Patient Name: Olivia Salinas [id number] Date: 05/26/2021 Reason for consult: Follow-up assessment Age:43 hours Mother breastfeeding when [facility] arrived in the room. Observed mother with infant latched poorly. Assist with flanging infants lips for wider gape. Mother complaint of pain with latch. Mother assist again with flanging lips. By mid feeding mother reported that feeding was more comfortable. Infant sustained latch for 35 mins.   Assist mother with refitting flanges. Mother reports that #27 flanges felt to large. #27 does pull extra amt of areola tissue in the tunnel.   Fit mother with #24 and mother reports that they feel the same.  Mothers  nipples are sore . Nipples are pink but no cracking.  advised to turn pump to lowest setting.   Mother reports that she plans to bottle feed infant  with DBM. DBM has been order. Mother to continue to pump after each feeding. Mother has comfort gels for sore nipples .   Maternal Data    Feeding Mother's Current Feeding Choice: Breast Milk and Donor Milk  LATCH Score Latch: Grasps breast easily, tongue down, lips flanged, rhythmical sucking.  Audible Swallowing: Spontaneous and intermittent  Type of Nipple: Everted at rest and after stimulation  Comfort (Breast/Nipple): Filling, red/small blisters or bruises, mild/mod discomfort  Hold (Positioning): Assistance needed to correctly position infant at breast and maintain latch. (assist with flanging infants lips)  LATCH Score: 8   Lactation Tools Discussed/Used    Interventions Interventions: Assisted with latch;Breast feeding basics reviewed;Breast compression;Adjust position;Support pillows;Position options;DEBP  Discharge    Consult Status Consult Status: Follow-up Date: 05/27/21 Follow-up type: In-patient    Jess Barters [facility] [place] 05/26/2021, 2:49 PM

## 2021-06-06 ENCOUNTER — Telehealth (HOSPITAL_COMMUNITY): Payer: Self-pay | Admitting: *Deleted

## 2021-06-06 NOTE — Telephone Encounter (Signed)
Mom reports feeling good. No concerns about herself at this time. EPDS=1(Hospital score= 7) Mom reports baby is doing well. Feeding, peeing, and pooping without difficulty. Safe sleep reviewed. Mom reports no concerns about baby at present.  Odis Hollingshead, RN 06-06-2021 at 12:28pm

## 2021-06-29 ENCOUNTER — Ambulatory Visit (INDEPENDENT_AMBULATORY_CARE_PROVIDER_SITE_OTHER): Payer: BC Managed Care – PPO | Admitting: Family Medicine

## 2021-06-29 ENCOUNTER — Other Ambulatory Visit: Payer: Self-pay

## 2021-06-29 NOTE — Progress Notes (Signed)
[facility] Partum Visit Note  Olivia Salinas is a 45 y.o. G49P1011 female who presents for a postpartum visit. She is 5 weeks postpartum following a normal spontaneous vaginal delivery.  I have fully reviewed the prenatal and intrapartum course. The delivery was at 39.2gestational weeks.  Anesthesia:IV Fentanyl for 2nd degree repair. Postpartum course has been uncomplicated. Baby is doing well. Baby is feeding by both breast and bottle - Gerber good start . Bleeding no bleeding. Bowel function is normal. . Bladder function is abnormal: -pt reporting some incontinence . Patient is not sexually active. Contraception method is rhythm method. Postpartum depression screening: negative. Score: 1   The pregnancy intention screening data noted above was reviewed. Potential methods of contraception were discussed. The patient elected to proceed with No data recorded.   Edinburgh Postnatal Depression Scale - 06/29/21 0956       Edinburgh Postnatal Depression Scale:  In the Past 7 Days   I have been able to laugh and see the funny side of things. 0    I have looked forward with enjoyment to things. 0    I have blamed myself unnecessarily when things went wrong. 0    I have been anxious or worried for no good reason. 1    I have felt scared or panicky for no good reason. 0    Things have been getting on top of me. 0    I have been so unhappy that I have had difficulty sleeping. 0    I have felt sad or miserable. 0    I have been so unhappy that I have been crying. 0    The thought of harming myself has occurred to me. 0    Edinburgh Postnatal Depression Scale Total 1             Health Maintenance Due  Topic Date Due   COVID-19 Vaccine (1) Never done   Pneumococcal Vaccine 36-87 Years old (1 - PCV) Never done    The following portions of the patient's history were reviewed and updated as appropriate: allergies, current medications, past family history, past medical history, past social  history, past surgical history, and problem list.  Review of Systems Pertinent items are noted in HPI.  Objective:  BP 91/62    Pulse (!) 57    General:  alert, cooperative, and no distress  Lungs: clear to auscultation bilaterally  Heart:  regular rate and rhythm, S1, S2 normal, no murmur, click, rub or gallop  Abdomen: soft, non-tender; bowel sounds normal; no masses,  no organomegaly   GU exam:   Laceration well healed. Mild tenderness       Assessment:   1. Postpartum exam  Plan:   Essential components of care per ACOG recommendations:  1.  Mood and well being: Patient with negative depression screening today. Reviewed local resources for support.  - Patient tobacco use? No.   - hx of drug use? No.    2. Infant care and feeding:  -Patient currently breastmilk feeding? Yes. Reviewed importance of draining breast regularly to support lactation.  -Social determinants of health (SDOH) reviewed in EPIC. No concerns  3. Sexuality, contraception and birth spacing - Patient does not want a pregnancy in the next year.  - Reviewed forms of contraception in tiered fashion. Patient desired natural family planning (NFP) today.   - Discussed birth spacing of 18 months  4. Sleep and fatigue -Encouraged family/partner/community support of 4 hrs of uninterrupted sleep to help  with mood and fatigue  5. Physical Recovery  - Discussed patients delivery and complications. She describes her labor as good. - Patient had a Vaginal, no problems at delivery. Patient had a 2nd degree laceration. Perineal healing reviewed. Patient expressed understanding - Patient has urinary incontinence? No. - Patient is not safe to resume physical and sexual activity - plan on resuming in 2 weeks with resolution of tenderness  6.  Health Maintenance - HM due items addressed Yes - Last pap smear  Diagnosis  Date Value Ref Range Status  05/24/2020   Final   - Negative for intraepithelial lesion or  malignancy (NILM)   Pap smear not done at today's visit.  -Breast Cancer screening indicated? - will have annual exam in 6 months.  7. Chronic Disease/Pregnancy Condition follow up: None  - PCP follow up  [place] for [place]

## 2021-07-09 DIAGNOSIS — Z9289 Personal history of other medical treatment: Secondary | ICD-10-CM

## 2021-07-09 HISTORY — DX: Personal history of other medical treatment: Z92.89

## 2021-11-30 ENCOUNTER — Encounter: Payer: Self-pay | Admitting: Emergency Medicine

## 2021-11-30 ENCOUNTER — Emergency Department
Admission: EM | Admit: 2021-11-30 | Discharge: 2021-11-30 | Discharge status: Absent without leave | Payer: Self-pay | Source: Home / Self Care

## 2021-11-30 ENCOUNTER — Emergency Department (INDEPENDENT_AMBULATORY_CARE_PROVIDER_SITE_OTHER): Payer: Self-pay

## 2021-11-30 ENCOUNTER — Emergency Department (INDEPENDENT_AMBULATORY_CARE_PROVIDER_SITE_OTHER)
Admission: EM | Admit: 2021-11-30 | Discharge: 2021-11-30 | Disposition: A | Discharge status: Home / Self Care | Payer: Self-pay | Source: Home / Self Care

## 2021-11-30 DIAGNOSIS — R7611 Nonspecific reaction to tuberculin skin test without active tuberculosis: Secondary | ICD-10-CM

## 2021-11-30 NOTE — Discharge Instructions (Addendum)
Advised/informed patient of chest x-ray results with hard copy provided to patient.

## 2021-11-30 NOTE — ED Triage Notes (Addendum)
Patient presents to Urgent Care with complaints of positive PPD. Patient reports wanting to get an CXR due to positive PPD. Was treated for TB in 1999. PPD is for new employment. Denies any cough or sob

## 2021-11-30 NOTE — ED Provider Notes (Signed)
[facility]    CSN: [id number] Arrival date & time: 11/30/21  1746      History   Chief Complaint Chief Complaint  Patient presents with   Positive PPD    Wanting CXR    HPI Olivia Salinas is a 46 y.o. female.   HPI 46 year old female presents with positive PPD test and request chest x-ray to rule out TB.  Reports chest x-ray was required by her current employer.  PMH significant for liver cysts and hemangioma.  History reviewed. No pertinent past medical history.  Patient Active Problem List   Diagnosis Date Noted   Hemangioma 03/21/2021   Neoplasm of uncertain behavior of skin 03/21/2021   Skin tag 03/21/2021   Viral warts 03/21/2021   History of cholestasis during pregnancy 03/06/2021   Supervision of other high risk pregnancy, antepartum, unspecified trimester 11/29/2020   AMA (advanced maternal age) multigravida 35+ 11/29/2020   Liver cyst 04/01/2018    History reviewed. No pertinent surgical history.  OB History     Gravida  3   Para  1   Term  1   Preterm      AB  1   Living  1      SAB  1   IAB      Ectopic      Multiple  0   Live Births  1            Home Medications    Prior to Admission medications   Medication Sig Start Date End Date Taking? Authorizing Provider  acetaminophen (TYLENOL) 325 MG tablet Take 2 tablets (650 mg total) by mouth every 4 (four) hours as needed (for pain scale < 4). Patient not taking: Reported on 05/18/2021 03/01/17   [name], MD  ibuprofen (ADVIL) 600 MG tablet Take 1 tablet (600 mg total) by mouth every 6 (six) hours as needed. 05/26/21   Patriciaann Clan, DO  Prenatal Vit-Fe Fumarate-FA (PRENATAL VITAMIN PO) Take 1 tablet by mouth daily.    [provider]    Family History Family History  Problem Relation Age of Onset   Hypertension Mother    Arthritis Mother    Arthritis Father     Social History Social History   Tobacco Use   Smoking status: Never    Smokeless tobacco: Never  Vaping Use   Vaping Use: Never used  Substance Use Topics   Alcohol use: No   Drug use: No     Allergies   Patient has no known allergies.   Review of Systems Review of Systems  All other systems reviewed and are negative.   Physical Exam Triage Vital Signs ED Triage Vitals  Enc Vitals Group     BP 11/30/21 1801 105/70     Pulse Rate 11/30/21 1801 (!) 55     Resp 11/30/21 1801 16     Temp 11/30/21 1801 98.7 F (37.1 C)     Temp Source 11/30/21 1801 Oral     SpO2 11/30/21 1801 98 %     Weight --      Height --      Head Circumference --      Peak Flow --      Pain Score 11/30/21 1759 0     Pain Loc --      Pain Edu? --      Excl. in [place]? --    No data found.  Updated Vital Signs BP 105/70 (BP Location:  Right Arm)   Pulse (!) 55   Temp 98.7 F (37.1 C) (Oral)   Resp 16   SpO2 98%      Physical Exam Vitals and nursing note reviewed.  Constitutional:      Appearance: Normal appearance. She is normal weight.  HENT:     Head: Normocephalic and atraumatic.     Mouth/Throat:     Mouth: Mucous membranes are moist.     Pharynx: Oropharynx is clear.  Eyes:     Extraocular Movements: Extraocular movements intact.     Conjunctiva/sclera: Conjunctivae normal.     Pupils: Pupils are equal, round, and reactive to light.  Cardiovascular:     Rate and Rhythm: Normal rate and regular rhythm.     Pulses: Normal pulses.     Heart sounds: Normal heart sounds. No murmur heard. Pulmonary:     Effort: Pulmonary effort is normal.     Breath sounds: Normal breath sounds. No wheezing, rhonchi or rales.  Musculoskeletal:     Cervical back: Normal range of motion and neck supple.  Skin:    General: Skin is warm and dry.  Neurological:     General: No focal deficit present.     Mental Status: She is alert and oriented to person, place, and time.     UC Treatments / Results  Labs (all labs ordered are listed, but only abnormal results are  displayed) Labs Reviewed - No data to display  EKG   Radiology DG Chest 2 View  Result Date: 11/30/2021 CLINICAL DATA:  Positive PPD EXAM: CHEST - 2 VIEW COMPARISON:  None Available. FINDINGS: Lungs are essentially clear.  No pleural effusion or pneumothorax. The heart is normal in size. Visualized osseous structures are within normal limits. IMPRESSION: Normal chest radiographs. Electronically Signed   By: Julian Hy M.D.   On: 11/30/2021 18:14    Procedures Procedures (including critical care time)  Medications Ordered in UC Medications - No data to display  Initial Impression / Assessment and Plan / UC Course  I have reviewed the triage vital signs and the nursing notes.  Pertinent labs & imaging results that were available during my care of the patient were reviewed by me and considered in my medical decision making (see chart for details).     MDM: 1.  Positive PPD-Advised/informed patient of chest x-ray results with hard copy provided to patient.  Patient discharged home, hemodynamically stable. Final Clinical Impressions(s) / UC Diagnoses   Final diagnoses:  Positive PPD     Discharge Instructions      Advised/informed patient of chest x-ray results with hard copy provided to patient.    ED Prescriptions   None    PDMP not reviewed this encounter.   Eliezer Lofts, [place] 11/30/21 1829

## 2022-05-22 ENCOUNTER — Encounter: Payer: Self-pay | Admitting: General Practice

## 2022-09-01 IMAGING — US US MFM OB FOLLOW-UP
1 series · 14 of 28 positions shown · non-contrast
Comparison: none

[Series 1: us mfm ob follow-up · 51 acquisitions, 14 frames shown]
[im 2/51]
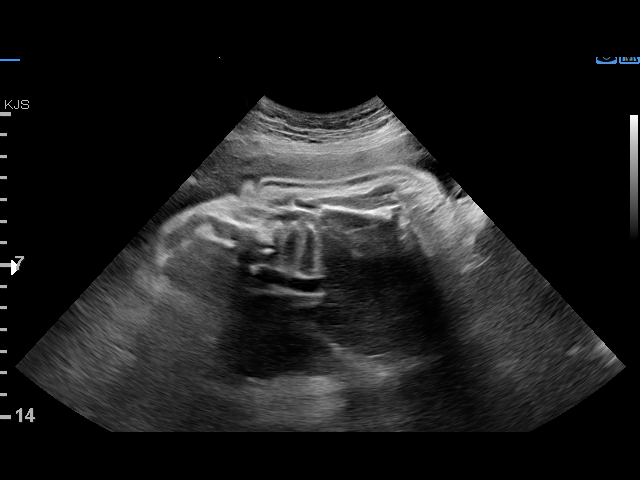
[im 6/51]
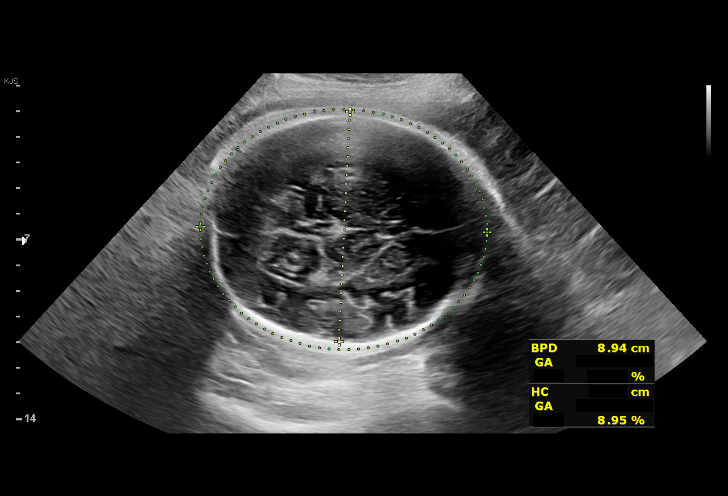
[im 10/51]
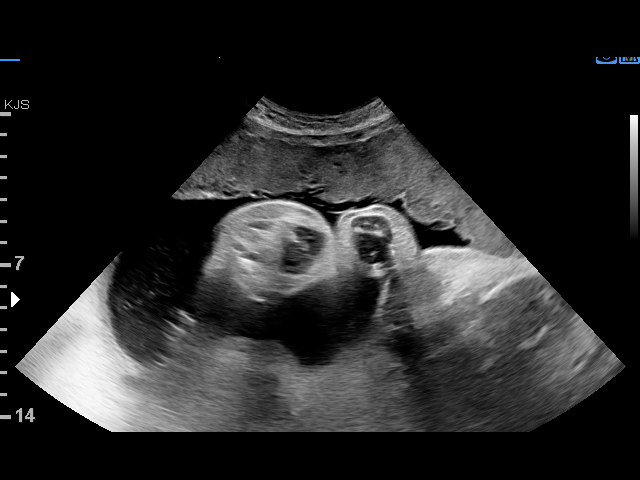
[im 13/51]
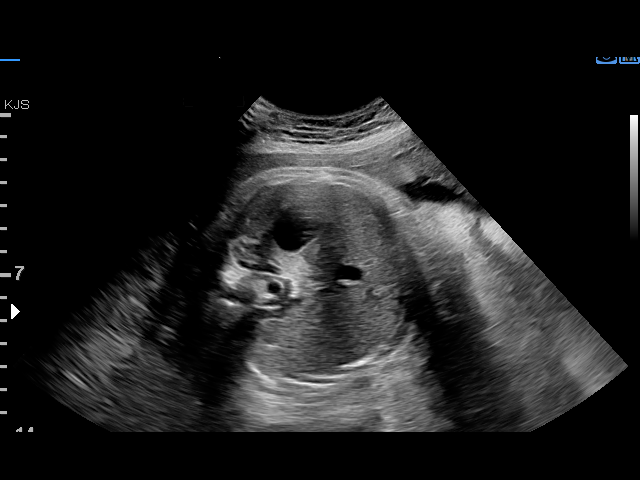
[im 17/51]
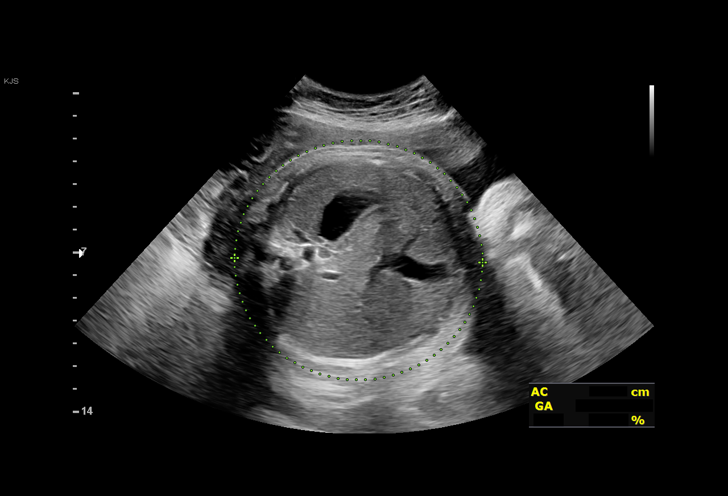
[im 21/51]
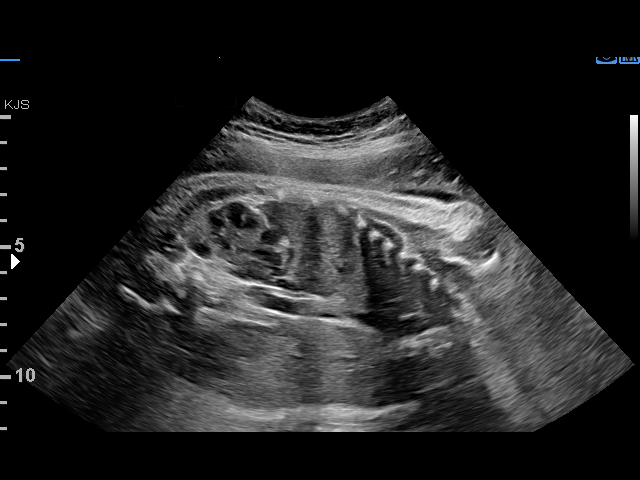
[im 25/51]
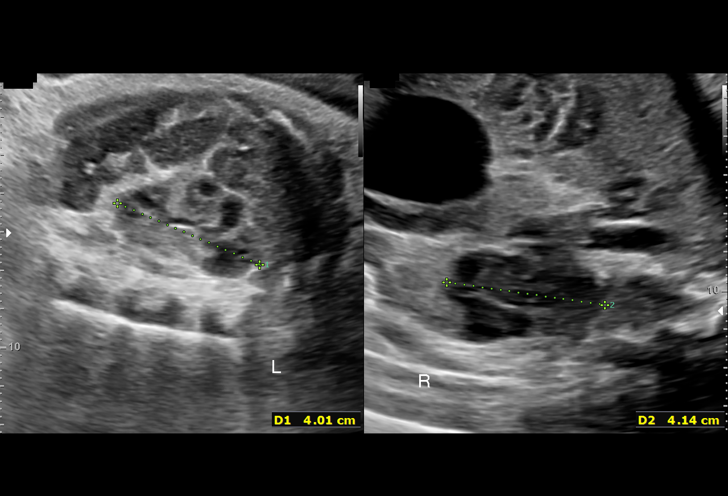
[im 28/51]
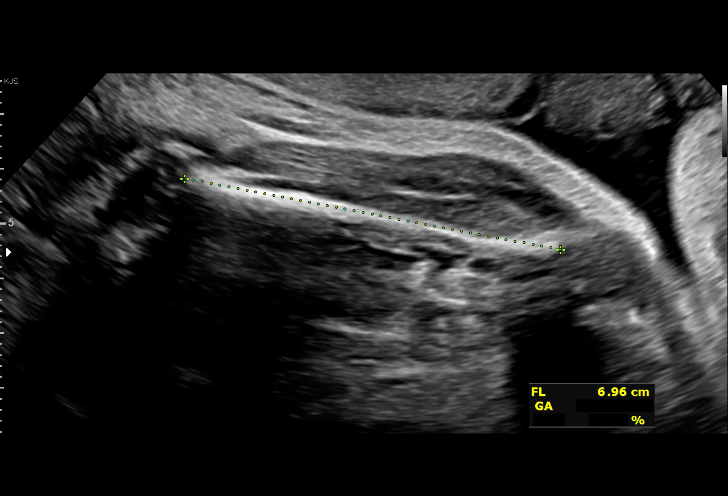
[im 32/51]
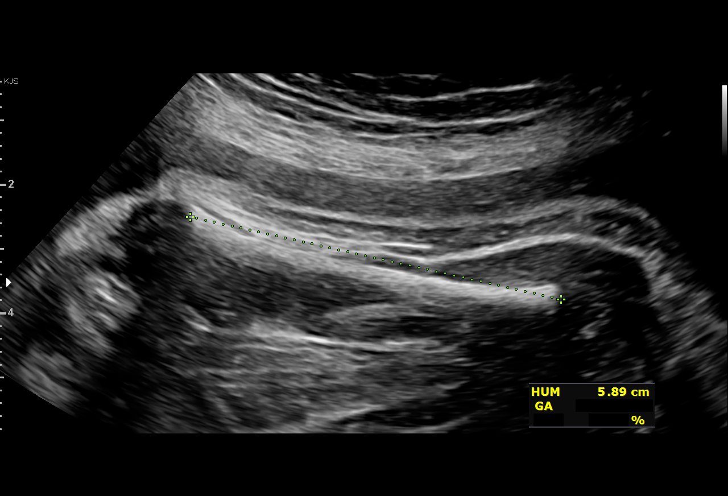
[im 36/51]
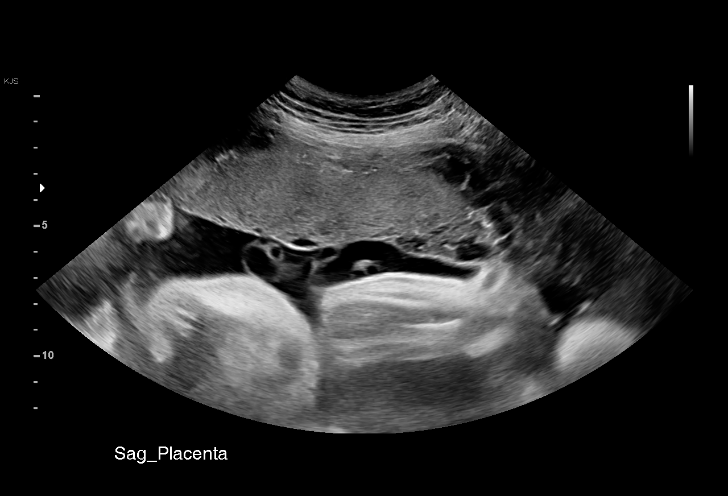
[im 39/51]
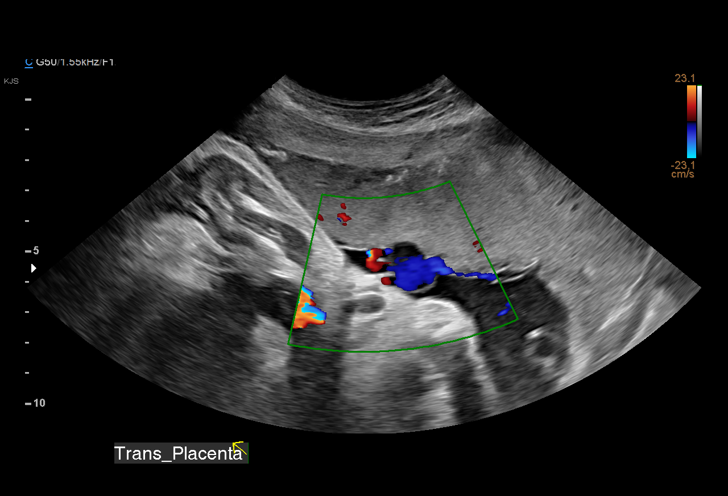
[im 43/51]
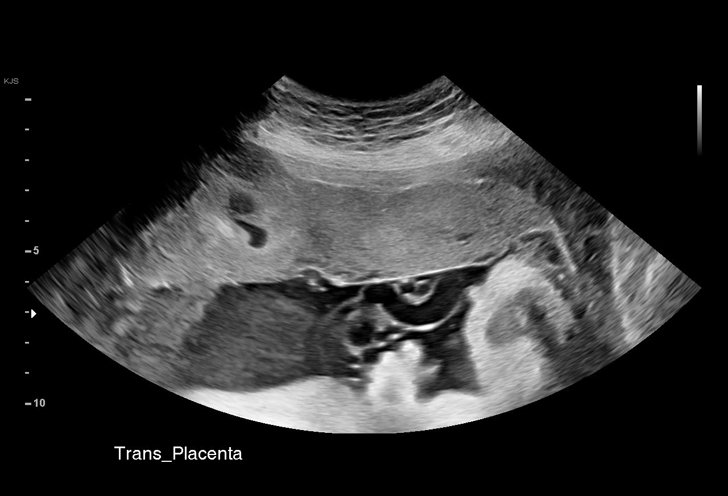
[im 47/51]
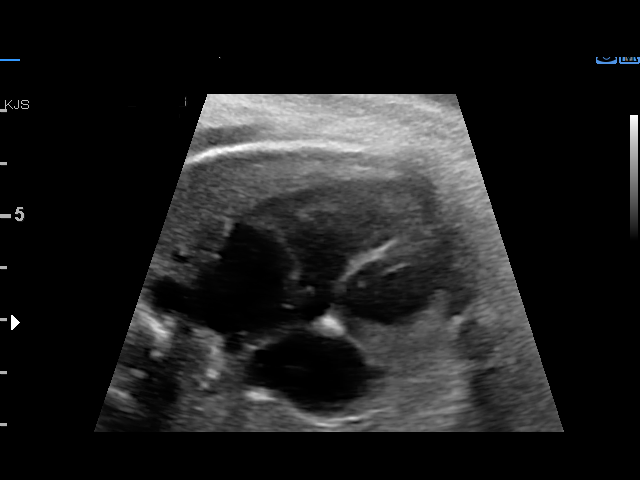
[im 51/51]
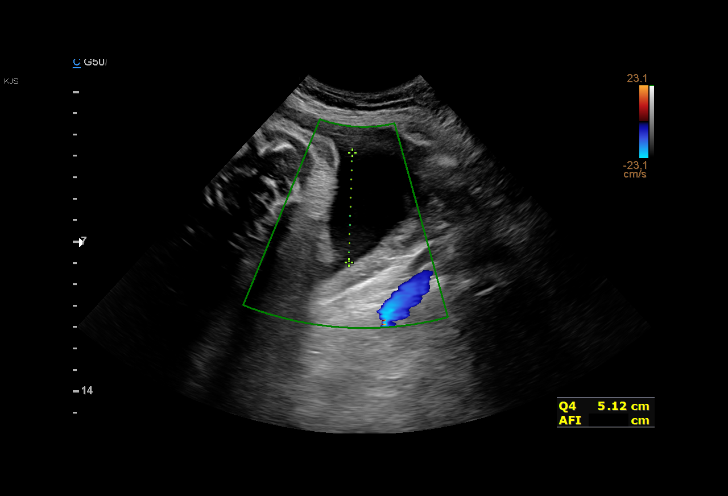

[14 of 28 positions shown; findings below may reference images not displayed]

Indications

 Advanced maternal age multigravida 35+,
 third trimester (AGE 45)
 Encounter for other antenatal screening
 follow-up
 37 weeks gestation of pregnancy
 Declined Genetic Testing
Fetal Evaluation

 Num Of Fetuses:         1
 Fetal Heart Rate(bpm):  137
 Cardiac Activity:       Observed
 Presentation:           Cephalic
 Placenta:               Anterior
 P. Cord Insertion:      Visualized, central

 Amniotic Fluid
 AFI FV:      Within normal limits

 AFI Sum(cm)     %Tile       Largest Pocket(cm)
 19.15           74

 RUQ(cm)       RLQ(cm)       LUQ(cm)        LLQ(cm)

Biophysical Evaluation

 Amniotic F.V:   Pocket => 2 cm             F. Tone:        Observed
 F. Movement:    Observed                   Score:          [DATE]
 F. Breathing:   Observed
Biometry

 BPD:      88.9  mm     G. Age:  36w 0d         25  %    CI:        75.93   %    70 - 86
                                                         FL/HC:      21.6   %    20.9 -
 HC:      323.4  mm     G. Age:  36w 4d         10  %    HC/AC:      0.97        0.92 -
 AC:      333.2  mm     G. Age:  37w 2d         56  %    FL/BPD:     78.6   %    71 - 87
 FL:       69.9  mm     G. Age:  35w 6d         13  %    FL/AC:      21.0   %    20 - 24
 HUM:      58.7  mm     G. Age:  34w 0d          6  %
 CER:        51  mm     G. Age:  37w 1d         50  %

 LV:          5  mm

 Est. FW:    1000  gm    6 lb 10 oz      36  %
OB History

 Gravidity:    3         Term:   1         SAB:   1
 Living:       1
Gestational Age

 LMP:           37w 4d        Date:  08/22/20                 EDD:   05/29/21
 U/S Today:     36w 3d                                        EDD:   06/06/21
 Best:          37w 4d     Det. By:  LMP  (08/22/20)          EDD:   05/29/21
Anatomy

 Cranium:               Appears normal         LVOT:                   Previously seen
 Cavum:                 Appears normal         Aortic Arch:            Previously seen
 Ventricles:            Appears normal         Ductal Arch:            Previously seen
 Choroid Plexus:        Previously seen        Diaphragm:              Appears normal
 Cerebellum:            Previously seen        Stomach:                Appears normal, left
                                                                       sided
 Posterior Fossa:       Previously seen        Abdomen:                Appears normal
 Nuchal Fold:           Previously seen        Abdominal Wall:         Previously seen
 Face:                  Orbits and profile     Cord Vessels:           Previously seen
                        previously seen
 Lips:                  Previously seen        Kidneys:                Appear normal
 Palate:                Appears normal         Bladder:                Appears normal
 Thoracic:              Previously seen        Spine:                  Previously seen
 Heart:                 Appears normal         Upper Extremities:      Previously seen
                        (4CH, axis, and
                        situs)
 RVOT:                  Previously seen        Lower Extremities:      Previously seen

 Other:  Female gender previously seen.
Cervix Uterus Adnexa

 Cervix
 Not visualized (advanced GA >51wks)
 Uterus
 No abnormality visualized.
Comments

 This patient was seen for a follow up growth scan and BPP
 due to advanced maternal age (45 years old).  She denies
 any problems since her last exam.
 She was informed that the fetal growth and amniotic fluid
 level appears appropriate for her gestational age.
 A BPP performed today was [DATE].
 She will return in 1 week for another BPP.  She is already
 scheduled for delivery on May 26, 2021 (in 2 weeks).

## 2023-04-15 ENCOUNTER — Ambulatory Visit (INDEPENDENT_AMBULATORY_CARE_PROVIDER_SITE_OTHER): Payer: BC Managed Care – PPO | Admitting: Physician Assistant

## 2023-04-15 ENCOUNTER — Encounter: Payer: Self-pay | Admitting: Physician Assistant

## 2023-04-15 VITALS — BP 112/82 | HR 75 | Ht 66.0 in | Wt 143.2 lb

## 2023-04-15 DIAGNOSIS — Z1211 Encounter for screening for malignant neoplasm of colon: Secondary | ICD-10-CM

## 2023-04-15 DIAGNOSIS — Z1231 Encounter for screening mammogram for malignant neoplasm of breast: Secondary | ICD-10-CM

## 2023-04-15 DIAGNOSIS — R739 Hyperglycemia, unspecified: Secondary | ICD-10-CM | POA: Diagnosis not present

## 2023-04-15 DIAGNOSIS — R1011 Right upper quadrant pain: Secondary | ICD-10-CM | POA: Diagnosis not present

## 2023-04-15 DIAGNOSIS — Z0000 Encounter for general adult medical examination without abnormal findings: Secondary | ICD-10-CM

## 2023-04-15 DIAGNOSIS — Z800 Family history of malignant neoplasm of digestive organs: Secondary | ICD-10-CM | POA: Diagnosis not present

## 2023-04-15 NOTE — Progress Notes (Signed)
New patient visit   Patient: Olivia Salinas   DOB: Jun 13, 1976   47 y.o. Female  MRN: [id number] Visit Date: 04/15/2023  Today's healthcare provider: Alfredia Ferguson, PA-C   Cc. New patient, annual physical  Subjective    Olivia Salinas is a G25P2 47 y.o. female who presents today as a new patient to establish care.  HPI  Discussed the use of AI scribe software for clinical note transcription with the patient, who gave verbal consent to proceed.  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 She reports occasional pain on the right side of the abdomen, which she is unsure if it is muscle-related or something internal. The pain is sometimes associated with eating, but can also occur randomly. The patient has a family history of cancer, with their father having died from pancreatic cancer and their brother from liver cancer. She has not yet started colon cancer screening, which is recommended to start at age 69. The patient is up to date on Pap smears, has a GYN.      Past Medical History:  Diagnosis Date   Benign liver cyst    History of positive PPD 2023   History reviewed. No pertinent surgical history. Family Status  Relation Name Status   Mother  (Not Specified)   Father  (Not Specified)  No partnership data on file   Family History  Problem Relation Age of Onset   Hypertension Mother    Arthritis Mother    Arthritis Father    Social History   Socioeconomic History   Marital status: Married    Spouse name: Sonny   Number of children: 2   Years of education: Not on file   Highest education level: Not on file  Occupational History   Not on file  Tobacco Use   Smoking status: Never   Smokeless tobacco: Never  Vaping Use   Vaping status: Never Used  Substance and Sexual Activity   Alcohol use: No   Drug use: No   Sexual activity: Not Currently    Birth control/protection: None   Other Topics Concern   Not on file  Social History Narrative   Not on file   Social Determinants of Health   Financial Resource Strain: Not on file  Food Insecurity: Not on file  Transportation Needs: Not on file  Physical Activity: Not on file  Stress: Not on file  Social Connections: Not on file   Outpatient Medications Prior to Visit  Medication Sig   ibuprofen (ADVIL) 600 MG tablet Take 1 tablet (600 mg total) by mouth every 6 (six) hours as needed.   Prenatal Vit-Fe Fumarate-FA (PRENATAL VITAMIN PO) Take 1 tablet by mouth daily.   [DISCONTINUED] acetaminophen (TYLENOL) 325 MG tablet Take 2 tablets (650 mg total) by mouth every 4 (four) hours as needed (for pain scale < 4). (Patient not taking: Reported on 05/18/2021)   No facility-administered medications prior to visit.   No Known Allergies  Immunization History  Administered Date(s) Administered   Influenza,inj,Quad PF,6+ Mos 05/25/2021   Tdap 12/24/2016, 03/06/2021    Health Maintenance  Topic Date Due   COVID-19 Vaccine (1) Never done   MAMMOGRAM  Never done   Colonoscopy  Never done   INFLUENZA VACCINE  02/07/2023   Cervical Cancer Screening (HPV/Pap Cotest)  05/24/2025   DTaP/Tdap/Td (3 - Td or Tdap) 03/07/2031   Hepatitis C Screening  Completed  HIV Screening  Completed   HPV VACCINES  Aged Out    Patient Care Team: Alfredia Ferguson, PA-C as PCP - General (Physician Assistant)  Review of Systems  Constitutional:  Negative for fatigue and fever.  Respiratory:  Negative for cough and shortness of breath.   Cardiovascular:  Negative for chest pain and leg swelling.  Gastrointestinal:  Positive for abdominal pain.  Neurological:  Negative for dizziness and headaches.       Objective    BP 112/82   Pulse 75   Ht 5\' 6"  (1.676 m)   Wt 143 lb 3.2 oz (65 kg)   LMP 03/18/2023 (Approximate)   SpO2 98%   BMI 23.11 kg/m    Physical Exam Constitutional:      General: She is awake.     Appearance:  She is well-developed. She is not ill-appearing.  HENT:     Head: Normocephalic.     Right Ear: Tympanic membrane normal.     Left Ear: Tympanic membrane normal.     Nose: Nose normal. No congestion or rhinorrhea.     Mouth/Throat:     Pharynx: No oropharyngeal exudate or posterior oropharyngeal erythema.  Eyes:     Conjunctiva/sclera: Conjunctivae normal.     Pupils: Pupils are equal, round, and reactive to light.  Neck:     Thyroid: No thyroid mass or thyromegaly.  Cardiovascular:     Rate and Rhythm: Normal rate and regular rhythm.     Heart sounds: Normal heart sounds.  Pulmonary:     Effort: Pulmonary effort is normal.     Breath sounds: Normal breath sounds.  Abdominal:     Palpations: Abdomen is soft.     Tenderness: There is no abdominal tenderness.  Musculoskeletal:     Right lower leg: No swelling. No edema.     Left lower leg: No swelling. No edema.  Lymphadenopathy:     Cervical: No cervical adenopathy.  Skin:    General: Skin is warm.  Neurological:     Mental Status: She is alert and oriented to person, place, and time.  Psychiatric:        Attention and Perception: Attention normal.        Mood and Affect: Mood normal.        Speech: Speech normal.        Behavior: Behavior normal. Behavior is cooperative.     Depression Screen    04/15/2023    9:26 AM 05/03/2021    9:28 AM 11/29/2020    2:16 PM  PHQ 2/9 Scores  PHQ - 2 Score 0 0 0  PHQ- 9 Score  1 0   No results found for any visits on 04/15/23.  Assessment & Plan      1. Annual physical exam Patient is up to date on Pap smears. Last mammogram was in 2021. Discussed the need for colon cancer screening starting at age 73. -Order mammogram -Order Cologuard home test for colon cancer screening -- reviewed colonoscopy vs cologuard, pt prefers cologuard -Order fasting blood work for routine screening  -General recommendations: --balanced diet high in fiber and protein, low in sugars, carbs,  fats. --physical activity/exercise 20-30 minutes 3-5 times a week    2. RUQ abdominal pain Episodic pain, possibly related to fatty food intake. Gallbladder etiology suggested as a possible cause. No nausea or persistent pain reported. -Advise patient to monitor diet and note any correlation with pain episodes. -Order blood work to check liver enzymes.  3.  Family history of pancreatic cancer Patient expressed concern due to family history of pancreatic and liver cancer. Discussed the option of genetic testing. -Consider referral for genetic counseling and testing, pt will contact office if interested   4. Hyperglycemia Historically,  check a1c  5. Colon cancer screening - Cologuard  6. Breast cancer screening by mammogram - MM 3D SCREENING MAMMOGRAM BILATERAL BREAST; Future    Return in about 1 year (around 04/14/2024) for CPE.     I, Alfredia Ferguson, PA-C have reviewed all documentation for this visit. The documentation on  04/15/23   for the exam, diagnosis, procedures, and orders are all accurate and complete.    Alfredia Ferguson, PA-C  [facility] [phone] (phone) [phone] (fax)  [facility]

## 2023-04-16 ENCOUNTER — Other Ambulatory Visit (INDEPENDENT_AMBULATORY_CARE_PROVIDER_SITE_OTHER): Payer: BC Managed Care – PPO

## 2023-04-16 DIAGNOSIS — Z0000 Encounter for general adult medical examination without abnormal findings: Secondary | ICD-10-CM | POA: Diagnosis not present

## 2023-04-16 DIAGNOSIS — R739 Hyperglycemia, unspecified: Secondary | ICD-10-CM

## 2023-04-16 LAB — LIPID PANEL
Cholesterol: 159 mg/dL (ref 0–200)
HDL: 67.8 mg/dL (ref >39.00)
LDL Cholesterol: 77 mg/dL (ref 0–99)
NonHDL: 91.44
Total CHOL/HDL Ratio: 2
Triglycerides: 71 mg/dL (ref 0.0–149.0)
VLDL: 14.2 mg/dL (ref 0.0–40.0)

## 2023-04-16 LAB — CBC WITH DIFFERENTIAL/PLATELET
Basophils Absolute: 0.1 10*3/uL (ref 0.0–0.1)
Basophils Relative: 0.8 % (ref 0.0–3.0)
Eosinophils Absolute: 0.3 10*3/uL (ref 0.0–0.7)
Eosinophils Relative: 3.7 % (ref 0.0–5.0)
HCT: 40.5 % (ref 36.0–46.0)
Hemoglobin: 13.1 g/dL (ref 12.0–15.0)
Lymphocytes Relative: 26.1 % (ref 12.0–46.0)
Lymphs Abs: 2.3 10*3/uL (ref 0.7–4.0)
MCHC: 32.3 g/dL (ref 30.0–36.0)
MCV: 87.1 fL (ref 78.0–100.0)
Monocytes Absolute: 0.7 10*3/uL (ref 0.1–1.0)
Monocytes Relative: 7.4 % (ref 3.0–12.0)
Neutro Abs: 5.5 10*3/uL (ref 1.4–7.7)
Neutrophils Relative %: 62 % (ref 43.0–77.0)
Platelets: 333 10*3/uL (ref 150.0–400.0)
RBC: 4.65 Mil/uL (ref 3.87–5.11)
RDW: 12.6 % (ref 11.5–15.5)
WBC: 8.9 10*3/uL (ref 4.0–10.5)

## 2023-04-16 LAB — COMPREHENSIVE METABOLIC PANEL
ALT: 17 U/L (ref 0–35)
AST: 16 U/L (ref 0–37)
Albumin: 4.2 g/dL (ref 3.5–5.2)
Alkaline Phosphatase: 48 U/L (ref 39–117)
BUN: 10 mg/dL (ref 6–23)
CO2: 25 meq/L (ref 19–32)
Calcium: 9.1 mg/dL (ref 8.4–10.5)
Chloride: 104 meq/L (ref 96–112)
Creatinine, Ser: 0.51 mg/dL (ref 0.40–1.20)
GFR: 112.8 mL/min (ref >60.00)
Glucose, Bld: 90 mg/dL (ref 70–99)
Potassium: 3.7 meq/L (ref 3.5–5.1)
Sodium: 137 meq/L (ref 135–145)
Total Bilirubin: 0.7 mg/dL (ref 0.2–1.2)
Total Protein: 7 g/dL (ref 6.0–8.3)

## 2023-04-16 LAB — HEMOGLOBIN A1C: Hgb A1c MFr Bld: 5.6 % (ref 4.6–6.5)

## 2023-05-06 ENCOUNTER — Ambulatory Visit (HOSPITAL_BASED_OUTPATIENT_CLINIC_OR_DEPARTMENT_OTHER)
Admission: RE | Admit: 2023-05-06 | Discharge: 2023-05-06 | Disposition: A | Discharge status: Home / Self Care | Payer: BC Managed Care – PPO | Source: Ambulatory Visit | Attending: Physician Assistant | Admitting: Physician Assistant

## 2023-05-06 ENCOUNTER — Encounter (HOSPITAL_BASED_OUTPATIENT_CLINIC_OR_DEPARTMENT_OTHER): Payer: Self-pay

## 2023-05-06 DIAGNOSIS — Z1231 Encounter for screening mammogram for malignant neoplasm of breast: Secondary | ICD-10-CM | POA: Insufficient documentation

## 2023-05-10 LAB — COLOGUARD: COLOGUARD: NEGATIVE

## 2024-06-30 NOTE — Progress Notes (Incomplete)
" °  °  New Patient Office Visit   Subjective     Patient ID: Olivia Salinas, female   DOB: 1975/08/03  Age: 48 y.o. MRN: [id number]   CC:  No chief complaint on file.     HPI Olivia Salinas presents to establish care.       Show/hide medication list[1] Past Medical History:  Diagnosis Date   Benign liver cyst    History of positive PPD 2023    No past surgical history on file.   Family History  Problem Relation Age of Onset   Hypertension Mother    Arthritis Mother    Arthritis Father     Social History   Socioeconomic History   Marital status: Married    Spouse name: Sonny   Number of children: 2   Years of education: Not on file   Highest education level: Not on file  Occupational History   Not on file  Tobacco Use   Smoking status: Never   Smokeless tobacco: Never  Vaping Use   Vaping status: Never Used  Substance and Sexual Activity   Alcohol use: No   Drug use: No   Sexual activity: Not Currently    Birth control/protection: None  Other Topics Concern   Not on file  Social History Narrative   Not on file   Social Drivers of Health   Tobacco Use: Low Risk (04/15/2023)   Patient History    Smoking Tobacco Use: Never    Smokeless Tobacco Use: Never    Passive Exposure: Not on file  Financial Resource Strain: Not on file  Food Insecurity: Not on file  Transportation Needs: Not on file  Physical Activity: Not on file  Stress: Not on file  Social Connections: Not on file  Depression (PHQ2-9): Low Risk (04/15/2023)   Depression (PHQ2-9)    PHQ-2 Score: 0  Alcohol Screen: Not on file  Housing: Not on file  Utilities: Not on file  Health Literacy: Not on file       ROS All review of systems negative except what is listed in the HPI    Objective     There were no vitals taken for this visit.  Physical Exam     Assessment & Plan:     Problem List Items Addressed This Visit   None            No follow-ups on file.  Waddell KATHEE Mon, NP  I,Emily Lagle,acting as a scribe for Waddell KATHEE Mon, NP.,have documented all relevant documentation on the behalf of Waddell KATHEE Mon, NP.  I, Waddell KATHEE Mon, NP, have reviewed all documentation for this visit. The documentation on 07/01/2024 for the exam, diagnosis, procedures, and orders are all accurate and complete.    [1]  Outpatient Medications Prior to Visit  Medication Sig   ibuprofen  (ADVIL ) 600 MG tablet Take 1 tablet (600 mg total) by mouth every 6 (six) hours as needed.   Prenatal Vit-Fe Fumarate-FA (PRENATAL VITAMIN PO) Take 1 tablet by mouth daily.   No facility-administered medications prior to visit.   "

## 2024-07-01 ENCOUNTER — Encounter: Admitting: Family Medicine
